# Patient Record
Sex: Female | Born: 1975 | Hispanic: No | Marital: Married | Smoking: Never smoker
Health system: Southern US, Community
[De-identification: ages and names within clinical notes are randomized; demographics above are authoritative.]

## PROBLEM LIST (undated history)

## (undated) ENCOUNTER — Ambulatory Visit: Payer: Managed Care, Other (non HMO)

## (undated) DIAGNOSIS — G243 Spasmodic torticollis: Secondary | ICD-10-CM

## (undated) DIAGNOSIS — N921 Excessive and frequent menstruation with irregular cycle: Secondary | ICD-10-CM

## (undated) DIAGNOSIS — M1711 Unilateral primary osteoarthritis, right knee: Secondary | ICD-10-CM

## (undated) DIAGNOSIS — T7840XA Allergy, unspecified, initial encounter: Secondary | ICD-10-CM

## (undated) HISTORY — PX: KNEE SURGERY: SHX244

## (undated) HISTORY — DX: Allergy, unspecified, initial encounter: T78.40XA

## (undated) HISTORY — DX: Unilateral primary osteoarthritis, right knee: M17.11

## (undated) HISTORY — DX: Spasmodic torticollis: G24.3

## (undated) HISTORY — DX: Excessive and frequent menstruation with irregular cycle: N92.1

---

## 2010-09-08 ENCOUNTER — Ambulatory Visit (INDEPENDENT_AMBULATORY_CARE_PROVIDER_SITE_OTHER): Payer: PRIVATE HEALTH INSURANCE | Admitting: Family Medicine

## 2010-09-08 ENCOUNTER — Encounter: Payer: Self-pay | Admitting: Family Medicine

## 2010-09-08 VITALS — BP 104/72 | HR 70 | Temp 99.0°F | Ht 65.5 in | Wt 111.0 lb

## 2010-09-08 DIAGNOSIS — G43909 Migraine, unspecified, not intractable, without status migrainosus: Secondary | ICD-10-CM

## 2010-09-08 DIAGNOSIS — R1031 Right lower quadrant pain: Secondary | ICD-10-CM

## 2010-09-08 DIAGNOSIS — G243 Spasmodic torticollis: Secondary | ICD-10-CM

## 2010-09-08 DIAGNOSIS — J300 Vasomotor rhinitis: Secondary | ICD-10-CM

## 2010-09-08 DIAGNOSIS — N921 Excessive and frequent menstruation with irregular cycle: Secondary | ICD-10-CM

## 2010-09-08 DIAGNOSIS — J309 Allergic rhinitis, unspecified: Secondary | ICD-10-CM

## 2010-09-08 DIAGNOSIS — E559 Vitamin D deficiency, unspecified: Secondary | ICD-10-CM

## 2010-09-08 MED ORDER — NONFORMULARY OR COMPOUNDED ITEM: Status: DC

## 2010-09-08 MED ORDER — AZELASTINE HCL 0.1 % NA SOLN: 2.0000 | Freq: Two times a day (BID) | NASAL | Status: DC

## 2010-09-08 MED ORDER — ERGOCALCIFEROL 1.25 MG (50000 UT) PO CAPS: 50000.0000 [IU] | ORAL_CAPSULE | ORAL | Status: AC

## 2010-09-09 ENCOUNTER — Encounter: Payer: Self-pay | Admitting: Family Medicine

## 2010-09-09 DIAGNOSIS — G243 Spasmodic torticollis: Secondary | ICD-10-CM | POA: Insufficient documentation

## 2010-09-09 DIAGNOSIS — N921 Excessive and frequent menstruation with irregular cycle: Secondary | ICD-10-CM | POA: Insufficient documentation

## 2010-09-09 DIAGNOSIS — G43909 Migraine, unspecified, not intractable, without status migrainosus: Secondary | ICD-10-CM | POA: Insufficient documentation

## 2010-09-09 NOTE — Progress Notes (Signed)
  Subjective:    Patient ID: Leslie Hughes, female    DOB: 1976-01-09, 35 y.o.   MRN: [id number]  HPI 35 yr old female to establish with [place] and to discuss chronic nasal congestion. 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 She describes daily nasal congestion without sinus pressure or itchy eyes or runny nose or sneezing. She has tried saline sprays, Flonase, and Nasonex with no improvement. She also complains of 3 days of mild sharp intermittent pains in the RLQ of the abdomen. These feel just like the ovarian cysts she has had in the past. No urinary or bowel symptoms. No nausea or fever. No back pain. Her LMP was 08-17-10, but her cycles are irregular most of the time.    Review of Systems  Constitutional: Negative.   HENT: Positive for congestion. Negative for nosebleeds, sore throat, rhinorrhea, sneezing, postnasal drip and sinus pressure.   Eyes: Negative.   Respiratory: Negative.   Gastrointestinal: Positive for abdominal pain. Negative for nausea, vomiting, diarrhea, constipation, blood in stool and abdominal distention.  Genitourinary: Negative.        Objective:   Physical Exam  Constitutional: She appears well-developed and well-nourished.  HENT:  Right Ear: External ear normal.  Left Ear: External ear normal.  Nose: Nose normal.  Mouth/Throat: Oropharynx is clear and moist. No oropharyngeal exudate.  Eyes: Conjunctivae are normal. Pupils are equal, round, and reactive to light.  Neck: No thyromegaly present.  Cardiovascular: Normal rate, regular rhythm, normal heart sounds and intact distal pulses.   Pulmonary/Chest: Effort normal and breath sounds normal.  Abdominal: Soft. Bowel sounds are normal. She exhibits no distension and no mass. There is no rebound and no guarding.       Very slightly tender in the RLQ  Lymphadenopathy:    She has no cervical adenopathy.          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 The RLQ pain is consistent with another ovarian cyst, so I recommended she see a GYN soon. She is trying to locate one in [facility] to see. She knows to be seen immediately if the pain gets worse, or if she develops any vomiting or fever.

## 2010-09-20 ENCOUNTER — Telehealth: Payer: Self-pay | Admitting: Family Medicine

## 2010-09-20 NOTE — Telephone Encounter (Signed)
Needs new rx for Progesterone Cream. Please send to a pharmacy that dr Clent Ridges had discussed with patient. She said that she did not know which pharmacy would do a compound rx.

## 2010-09-21 NOTE — Telephone Encounter (Signed)
I gave her a rx when I  saw her on 09-08-10. If she needs this to be called to a different pharmacy, please do so

## 2010-09-23 NOTE — Telephone Encounter (Signed)
I spoke with pt and pro compounding pharmacy. Pt was on Progesterone Cream 75 mg/ml apply 2 clicks daily, days 1-25 of cycle. Pt did not get a script while in office on 09/08/10. She would like script called to [facility] here in [place].

## 2010-09-24 MED ORDER — AMBULATORY NON FORMULARY MEDICATION: Status: DC

## 2010-09-24 NOTE — Telephone Encounter (Signed)
Please call in a one year supply

## 2010-09-24 NOTE — Telephone Encounter (Signed)
I called in script to [facility] and spoke with pt.

## 2011-01-10 ENCOUNTER — Ambulatory Visit (INDEPENDENT_AMBULATORY_CARE_PROVIDER_SITE_OTHER): Payer: PRIVATE HEALTH INSURANCE | Admitting: Family Medicine

## 2011-01-10 ENCOUNTER — Encounter: Payer: Self-pay | Admitting: Family Medicine

## 2011-01-10 VITALS — BP 106/68 | HR 107 | Temp 98.7°F | Wt 109.0 lb

## 2011-01-10 DIAGNOSIS — J329 Chronic sinusitis, unspecified: Secondary | ICD-10-CM

## 2011-01-10 MED ORDER — AZITHROMYCIN 250 MG PO TABS: ORAL_TABLET | ORAL | Status: AC

## 2011-01-10 MED ORDER — NONFORMULARY OR COMPOUNDED ITEM: Status: DC

## 2011-01-10 NOTE — Progress Notes (Signed)
  Subjective:    Patient ID: Leslie Hughes, female    DOB: 30-Dec-1975, 35 y.o.   MRN: [id number]  HPI Here for 3 days of sinus pressure, PND, ST, HA, a nd a dry cough. Using Mucinex D and cough drops.    Review of Systems  Constitutional: Negative.   HENT: Positive for congestion, postnasal drip and sinus pressure.   Eyes: Negative.   Respiratory: Positive for cough.        Objective:   Physical Exam  Constitutional: She appears well-developed and well-nourished.  HENT:  Right Ear: External ear normal.  Left Ear: External ear normal.  Nose: Nose normal.  Mouth/Throat: Oropharynx is clear and moist. No oropharyngeal exudate.  Eyes: Conjunctivae are normal. Pupils are equal, round, and reactive to light.  Neck: Neck supple. No thyromegaly present.  Pulmonary/Chest: Effort normal and breath sounds normal. No respiratory distress. She has no wheezes. She has no rales. She exhibits no tenderness.  Lymphadenopathy:    She has no cervical adenopathy.          Assessment & Plan:  Recheck prn

## 2011-03-17 ENCOUNTER — Encounter: Payer: Self-pay | Admitting: Family Medicine

## 2011-03-17 ENCOUNTER — Ambulatory Visit (INDEPENDENT_AMBULATORY_CARE_PROVIDER_SITE_OTHER): Payer: PRIVATE HEALTH INSURANCE | Admitting: Family Medicine

## 2011-03-17 VITALS — BP 100/60 | Temp 98.6°F | Wt 112.0 lb

## 2011-03-17 DIAGNOSIS — J329 Chronic sinusitis, unspecified: Secondary | ICD-10-CM

## 2011-03-17 NOTE — Patient Instructions (Signed)

## 2011-03-17 NOTE — Progress Notes (Signed)
Subjective:    Patient ID: Leslie Hughes, female    DOB: 1975-12-18, 35 y.o.   MRN: [id number]  HPI 35 year old white female, nonsmoker, patient of Dr. Clent Ridges his in with complaints of sneezing, scratchy throat, sinus pressure and pain going on for 3 days, and worsening. She's been taking Sudafed over-the-counter and that helped some. She's began to develop a low-grade fever. Has a history of sinusitis and pneumonia.   Review of Systems  Constitutional: Positive for fever, chills and fatigue.  HENT: Positive for congestion, sneezing, postnasal drip and sinus pressure.   Eyes: Negative.   Respiratory: Positive for cough.   Cardiovascular: Negative.   Musculoskeletal: Negative.   Neurological: Negative.   Hematological: Negative.    Past Medical History  Diagnosis Date  . Migraine     sees Dr. Kristian Covey of [facility]  . Cervical dystonia     sees Dr. Kristian Covey of [facility]  . Metrorrhagia   . Allergy   . Vitamin D deficiency     History   Social History  . Marital Status: Married    Spouse Name: N/A    Number of Children: N/A  . Years of Education: N/A   Occupational History  . Not on file.   Social History Main Topics  . Smoking status: Never Smoker   . Smokeless tobacco: Never Used  . Alcohol Use: 0.5 oz/week    1 drink(s) per week  . Drug Use: No  . Sexually Active: Not on file   Other Topics Concern  . Not on file   Social History Narrative  . No narrative on file    Past Surgical History  Procedure Date  . Knee surgery 1999 and 2008    arthroscopy to right knee (tennis injuries)  . Cesarean section 2009    Family History  Problem Relation Age of Onset  . Hypothyroidism Mother   . Thyroid disease Mother   . Cancer Father     bladder  . Hyperlipidemia Father   . Hypertension Father   . Skin cancer Paternal Grandmother   . Colon cancer Paternal Grandfather     Allergies  Allergen Reactions  . Amoxil     Current  Outpatient Prescriptions on File Prior to Visit  Medication Sig Dispense Refill  . AMBULATORY NON FORMULARY MEDICATION Progesterone cream 75 mg/ml. Directions apply 2 clicks days 1-25 of cycle.  3 Tube  3  . clonazePAM (KLONOPIN) 1 MG tablet Take 1 mg by mouth 2 (two) times daily as needed.        . Multiple Vitamin (MULTIVITAMIN) tablet Take 1 tablet by mouth daily.        . NONFORMULARY/COMPOUNDED ITEM Progesterone cream 75mg /ml to apply 0.5 ml to inner thighs daily on days 1-25 of cycle  1 each  11  . rizatriptan (MAXALT) 10 MG tablet Take 10 mg by mouth as needed. May repeat in 2 hours if needed       . tiZANidine (ZANAFLEX) 4 MG tablet Take 4 mg by mouth every 6 (six) hours as needed.       . trihexyphenidyl (ARTANE) 2 MG tablet Take 2 mg by mouth 2 (two) times daily. Take 1/2 tablet       . azelastine (ASTELIN) 137 MCG/SPRAY nasal spray Place 2 sprays into the nose 2 (two) times daily. Use in each nostril as directed  30 mL  11  . ergocalciferol (VITAMIN D2) 50000 UNITS capsule Take 1 capsule (50,000 Units total) by  mouth once a week.  1 capsule  0    BP 100/60  Temp(Src) 98.6 F (37 C) (Oral)  Wt 112 lb (50.803 kg)chart    Objective:   Physical Exam  Constitutional: She is oriented to person, place, and time. She appears well-developed and well-nourished.  HENT:  Right Ear: External ear normal.  Left Ear: External ear normal.  Mouth/Throat: Oropharynx is clear and moist.       Maxillary sinus tenderness noted to palpation.  Neck: Normal range of motion. Neck supple.  Cardiovascular: Normal rate, regular rhythm and normal heart sounds.   Pulmonary/Chest: Effort normal and breath sounds normal.  Musculoskeletal: Normal range of motion.  Neurological: She is alert and oriented to person, place, and time.  Skin: Skin is warm and dry.          Assessment & Plan:  Assessment: Early sinusitis   Plan: Z-Pak as directed. Over-the-counter antihistamine decongestant when  necessary. Rest. Drink plenty of fluids. Call if symptoms worsen or persist. Recheck as scheduled and when necessary

## 2011-03-18 ENCOUNTER — Telehealth: Payer: Self-pay | Admitting: Family Medicine

## 2011-03-18 MED ORDER — AZITHROMYCIN 250 MG PO TABS: ORAL_TABLET | ORAL | Status: AC

## 2011-03-18 NOTE — Telephone Encounter (Signed)
Please call in Zpak for patient.

## 2011-03-18 NOTE — Telephone Encounter (Signed)
Done

## 2011-03-18 NOTE — Telephone Encounter (Signed)
Pt was seen yesterday and was prescribed a z-pak. Pt went to pharmacy to pick up and it was not there. Pt requesting it be re-submitted.  [facility]- [phone]

## 2011-07-13 ENCOUNTER — Ambulatory Visit (INDEPENDENT_AMBULATORY_CARE_PROVIDER_SITE_OTHER): Payer: PRIVATE HEALTH INSURANCE | Admitting: Family Medicine

## 2011-07-13 ENCOUNTER — Encounter: Payer: Self-pay | Admitting: Family Medicine

## 2011-07-13 VITALS — BP 102/64 | HR 90 | Temp 98.7°F | Wt 106.0 lb

## 2011-07-13 DIAGNOSIS — L723 Sebaceous cyst: Secondary | ICD-10-CM

## 2011-07-13 DIAGNOSIS — G243 Spasmodic torticollis: Secondary | ICD-10-CM

## 2011-07-13 MED ORDER — DOXYCYCLINE HYCLATE 100 MG PO CAPS: 100.0000 mg | ORAL_CAPSULE | Freq: Two times a day (BID) | ORAL | Status: AC

## 2011-07-13 NOTE — Progress Notes (Signed)
  Subjective:    Patient ID: Leslie Hughes, female    DOB: 1975/08/31, 36 y.o.   MRN: [id number]  HPI Here for 2 reasons. First she has a recurrent painful cyst behind the left ear lobe which has been swollen again now for the past week. Sometimes it opens and drains. Also she asks about getting a second opinion about her chronic neck pain, which has been diagnosed as cervical dystonia. She is currently seeing a Neurologist in [place], and he has been giving her Botox injections which cover her entire head and neck. These injections are quite painful for her to get, and they are not very helpful either. She has had nerve ablations with steroid injections, but these did not work.    Review of Systems  Constitutional: Negative.   HENT: Positive for neck pain and neck stiffness.   Neurological: Positive for headaches.       Objective:   Physical Exam  Constitutional: She is oriented to person, place, and time. She appears well-developed and well-nourished.  HENT:  Head: Normocephalic and atraumatic.       There is a tender cystic lesion in the crease behind the left ear lobe  Eyes: Conjunctivae and EOM are normal. Pupils are equal, round, and reactive to light.  Neck: No thyromegaly present.  Lymphadenopathy:    She has no cervical adenopathy.  Neurological: She is alert and oriented to person, place, and time. No cranial nerve deficit.          Assessment & Plan:  We will treat the current infection in the sebaceous cyst with Doxycycline, and she can apply warm compresses. She needs to have a permanent resection of this cyst, so we will refer her to ENT. As for the cervical dystonia, we refer her to a Pain Management clinic to consider other forms of nerve ablation such as radio frequency or cryoablations.

## 2011-07-22 ENCOUNTER — Other Ambulatory Visit: Payer: Self-pay | Admitting: Otolaryngology

## 2011-08-30 ENCOUNTER — Telehealth: Payer: Self-pay | Admitting: Family Medicine

## 2011-08-30 DIAGNOSIS — E349 Endocrine disorder, unspecified: Secondary | ICD-10-CM

## 2011-08-30 NOTE — Telephone Encounter (Signed)
Pt called and said that she used to see a doctor in [place] re: Endocrinology issues. Pt said that it been over 2 yrs since pt had testing done in order to get Progesterone refilled. Pt is req to get a referral to Endocrinologists with the [facility]. Pls call.

## 2011-08-30 NOTE — Telephone Encounter (Signed)
Referral was done  

## 2011-09-01 NOTE — Telephone Encounter (Signed)
I left voice message with below information. 

## 2011-09-19 ENCOUNTER — Ambulatory Visit (INDEPENDENT_AMBULATORY_CARE_PROVIDER_SITE_OTHER): Payer: PRIVATE HEALTH INSURANCE | Admitting: Endocrinology

## 2011-09-19 ENCOUNTER — Encounter: Payer: Self-pay | Admitting: Endocrinology

## 2011-09-19 ENCOUNTER — Other Ambulatory Visit (INDEPENDENT_AMBULATORY_CARE_PROVIDER_SITE_OTHER): Payer: PRIVATE HEALTH INSURANCE

## 2011-09-19 VITALS — BP 108/78 | HR 71 | Temp 98.5°F | Ht 66.0 in | Wt 109.0 lb

## 2011-09-19 DIAGNOSIS — E559 Vitamin D deficiency, unspecified: Secondary | ICD-10-CM

## 2011-09-19 DIAGNOSIS — N921 Excessive and frequent menstruation with irregular cycle: Secondary | ICD-10-CM

## 2011-09-19 LAB — BASIC METABOLIC PANEL
Calcium: 9.7 mg/dL (ref 8.4–10.5)
GFR: 105.56 mL/min (ref >60.00)
Glucose, Bld: 84 mg/dL (ref 70–99)
Sodium: 139 mEq/L (ref 135–145)

## 2011-09-19 NOTE — Patient Instructions (Addendum)
blood tests are being requested for you today.  You will receive a letter with results. i don't know if the progesterone is helping your symptoms.  However, as long as you are on the IUD, it is safe in my opinion to continue it.  Also, it would be fine with me for a trial off it.

## 2011-09-19 NOTE — Progress Notes (Signed)
Subjective:    Patient ID: Leslie Hughes, female    DOB: [birth year], 36 y.o.   MRN: [id number]  HPI Pt says she had menarche at age 10.  G1P1 (2009, 1 son).  Pt says she has had an iud x 3 years. She says it took them 1 year to achieve pregnancy, but she was never rx'ed for infertility.   She has a few years of intermittent severe headaches throughout the head, and assoc n/v.  Pt says she has a persisitently low vitamin-d level, despite aggressive suppelmentation.   She says she was found to have osteopenia at age 17, when she had knee surgery.  Migraine was better during pregnancy, so she had labs.  She had labs showing high progesterone, so it was postulated that progesterone was having a protective effect on her sxs.  She has been on a topical progesterone supplement x 3 years.  She says this has helped the migraine, but not the other sxs.   She says dr Vickey Sages felt this might not be doing any good, but is ok with her continuing it as long as she is on effective contraception.  Past Medical History  Diagnosis Date  . Migraine     sees Dr. Kristian Covey of [facility]  . Cervical dystonia     sees Dr. Kristian Covey of [facility]  . Metrorrhagia   . Allergy   . Vitamin d deficiency     Past Surgical History  Procedure Date  . Knee surgery 1999 and 2008    arthroscopy to right knee (tennis injuries)  . Cesarean section 2009    History   Social History  . Marital Status: Married    Spouse Name: N/A    Number of Children: N/A  . Years of Education: N/A   Occupational History  . Not on file.   Social History Main Topics  . Smoking status: Never Smoker   . Smokeless tobacco: Never Used  . Alcohol Use: 0.5 oz/week    1 drink(s) per week  . Drug Use: No  . Sexually Active: Not on file   Other Topics Concern  . Not on file   Social History Narrative  . No narrative on file    Current Outpatient Prescriptions on File Prior to Visit  Medication Sig Dispense  Refill  . AMBULATORY NON FORMULARY MEDICATION Progesterone cream 75 mg/ml. Directions apply 2 clicks days 1-25 of cycle.  3 Tube  3  . Multiple Vitamin (MULTIVITAMIN) tablet Take 1 tablet by mouth daily.        . NONFORMULARY/COMPOUNDED ITEM Progesterone cream 75mg /ml to apply 0.5 ml to inner thighs daily on days 1-25 of cycle  1 each  11  . rizatriptan (MAXALT) 10 MG tablet Take 10 mg by mouth as needed. May repeat in 2 hours if needed       . tiZANidine (ZANAFLEX) 4 MG tablet Take 4 mg by mouth every 6 (six) hours as needed.       . clonazePAM (KLONOPIN) 1 MG tablet Take 1 mg by mouth 2 (two) times daily as needed.          Allergies  Allergen Reactions  . Amoxicillin     Family History  Problem Relation Age of Onset  . Hypothyroidism Mother   . Thyroid disease Mother   . Cancer Father     bladder  . Hyperlipidemia Father   . Hypertension Father   . Skin cancer Paternal Grandmother   . Colon  cancer Paternal Grandfather     BP 108/78  Pulse 71  Temp 98.5 F (36.9 C) (Oral)  Ht 5\' 6"  (1.676 m)  Wt 109 lb (49.442 kg)  BMI 17.59 kg/m2  SpO2 96%  LMP 09/12/2011  Review of Systems She has fatigue, knee pain, insomnia, myalgias, easy bruising, and muscle cramps.  She has regular menses.  denies fever, weight change, depression, hair loss, sob, memory loss, constipation, numbness, blurry vision, dry skin, rhinorrhea, and syncope.      Objective:   Physical Exam VS: see vs page GEN: no distress HEAD: head: no deformity eyes: no periorbital swelling, no proptosis external nose and ears are normal mouth: no lesion seen NECK: supple, thyroid is not enlarged CHEST WALL: no deformity LUNGS:  Clear to auscultation CV: reg rate and rhythm, no murmur ABD: abdomen is soft, nontender.  no hepatosplenomegaly.  not distended.  no hernia MUSCULOSKELETAL: muscle bulk and strength are grossly normal.  no obvious joint swelling.  gait is normal and steady EXTEMITIES: no deformity.  no  ulcer on the feet.  feet are of normal color and temp.  no edema PULSES: dorsalis pedis intact bilat.  no carotid bruit NEURO:  cn 2-12 grossly intact.   readily moves all 4's.  sensation is intact to touch on the feet SKIN:  Normal texture and temperature.  No rash or suspicious lesion is visible.   NODES:  None palpable at the neck. PSYCH: alert, oriented x3.  Does not appear anxious nor depressed.  Vit D, 25-Hydroxy 26 (L)      Assessment & Plan:  H/o osteopenia, uncertain etiology.  Vitamin-d deficiency, mild Migraine, apparently helped by topical progesterone

## 2011-09-20 ENCOUNTER — Encounter: Payer: Self-pay | Admitting: Endocrinology

## 2011-09-20 LAB — VITAMIN D 25 HYDROXY (VIT D DEFICIENCY, FRACTURES): Vit D, 25-Hydroxy: 26 ng/mL — ABNORMAL LOW (ref 30–89)

## 2011-09-21 ENCOUNTER — Telehealth: Payer: Self-pay | Admitting: *Deleted

## 2011-09-21 NOTE — Telephone Encounter (Signed)
Called pt to inform of lab results, pt informed (letter also mailed to pt). 

## 2012-01-17 ENCOUNTER — Encounter: Payer: Self-pay | Admitting: Family Medicine

## 2012-01-17 ENCOUNTER — Ambulatory Visit (INDEPENDENT_AMBULATORY_CARE_PROVIDER_SITE_OTHER): Payer: PRIVATE HEALTH INSURANCE | Admitting: Family Medicine

## 2012-01-17 VITALS — BP 100/62 | HR 71 | Temp 98.7°F | Wt 110.0 lb

## 2012-01-17 DIAGNOSIS — R0781 Pleurodynia: Secondary | ICD-10-CM

## 2012-01-17 DIAGNOSIS — R079 Chest pain, unspecified: Secondary | ICD-10-CM

## 2012-01-17 DIAGNOSIS — M542 Cervicalgia: Secondary | ICD-10-CM

## 2012-01-17 MED ORDER — NONFORMULARY OR COMPOUNDED ITEM: Status: DC

## 2012-01-17 NOTE — Progress Notes (Signed)
  Subjective:    Patient ID: Leslie Hughes, female    DOB: April 18, 1975, 36 y.o.   MRN: [id number]  HPI Here for a one year hx of intermittent pain and muscle spasms in the left upper abdomen. This started a year ago, and it is getting more frequent. Now it happens once a week. When it does start it lasts about a day and then stops. She describes the muscles just under the left lower ribs as knotting up and being painful. No trouble with urinating or BMs. She does yoga about once a week. No lifting weights. Also she feels like she has reached the maximum improvement that she can with seeing Dr. Philippa Chester at the [facility]. She asks if there is someone here she can see for her cervical dystonia.    Review of Systems  Constitutional: Negative.   HENT: Positive for neck pain and neck stiffness.   Gastrointestinal: Positive for abdominal pain. Negative for nausea, vomiting, diarrhea, constipation, blood in stool and abdominal distention.       Objective:   Physical Exam  Constitutional: She appears well-developed and well-nourished.  Abdominal: Soft. Bowel sounds are normal. She exhibits no distension and no mass. There is no rebound and no guarding.       She is slightly tender in the LUQ just below the left inferior anterior ribs           Assessment & Plan:  This seems to be a chronic muscle strain of the rectus muscles inserting onto the ribs. We will refer her to Dr. Ethelene Hal for this and also ask him to evaluate her neck.

## 2012-09-05 ENCOUNTER — Encounter: Payer: Self-pay | Admitting: Family Medicine

## 2012-09-05 ENCOUNTER — Ambulatory Visit (INDEPENDENT_AMBULATORY_CARE_PROVIDER_SITE_OTHER): Payer: PRIVATE HEALTH INSURANCE | Admitting: Family Medicine

## 2012-09-05 ENCOUNTER — Ambulatory Visit (INDEPENDENT_AMBULATORY_CARE_PROVIDER_SITE_OTHER)
Admission: RE | Admit: 2012-09-05 | Discharge: 2012-09-05 | Disposition: A | Discharge status: Home / Self Care | Payer: PRIVATE HEALTH INSURANCE | Source: Ambulatory Visit | Attending: Cardiology | Admitting: Cardiology

## 2012-09-05 VITALS — BP 110/70 | HR 77 | Temp 98.5°F | Wt 113.0 lb

## 2012-09-05 DIAGNOSIS — R1031 Right lower quadrant pain: Secondary | ICD-10-CM

## 2012-09-05 DIAGNOSIS — R109 Unspecified abdominal pain: Secondary | ICD-10-CM

## 2012-09-05 LAB — POCT URINALYSIS DIPSTICK
Glucose, UA: NEGATIVE
Leukocytes, UA: NEGATIVE
Nitrite, UA: NEGATIVE
Protein, UA: NEGATIVE
pH, UA: 6

## 2012-09-05 MED ORDER — IOHEXOL 300 MG/ML  SOLN: 100.0000 mL | Freq: Once | INTRAMUSCULAR | Status: AC | PRN

## 2012-09-05 NOTE — Progress Notes (Signed)
Quick Note:  I spoke with pt ______ 

## 2012-09-05 NOTE — Progress Notes (Signed)
  Subjective:    Patient ID: Leslie Hughes, female    DOB: June 25, 1975, 37 y.o.   MRN: [id number]  HPI Here for one week of RLQ pains that wax and wane. They are sharp in nature but they do not radiate. She has been nauseated but has not vomited. No fevers. Her BMs are normal. No urinary symptoms. Her LMP was 3 weeks ago. She has a Copper 7 IUD in place. Her appetite is decreased.    Review of Systems  Constitutional: Positive for appetite change. Negative for fever, chills, diaphoresis and unexpected weight change.  Respiratory: Negative.   Cardiovascular: Negative.   Gastrointestinal: Positive for nausea, abdominal pain and abdominal distention. Negative for vomiting, diarrhea, constipation and blood in stool.  Genitourinary: Negative.        Objective:   Physical Exam  Constitutional:  Alert, uncomfortable.   Cardiovascular: Normal rate, regular rhythm, normal heart sounds and intact distal pulses.   Pulmonary/Chest: Effort normal and breath sounds normal.  Abdominal: Soft. Bowel sounds are normal. She exhibits no distension and no mass. There is no guarding.  She is quite tender with positive  rebound in the RLQ           Assessment & Plan:  This could be appendicitis or an ovarian cyst, among other things. We will get labs and a stat CT of the abdomen and pelvis today.

## 2012-09-06 ENCOUNTER — Telehealth: Payer: Self-pay | Admitting: Family Medicine

## 2012-09-06 NOTE — Telephone Encounter (Signed)
[facility] called for results of pt's CT scan done 6/18. Pt was told to make her appt and a copy would be sent over there. Pt had appt @ 3:15 today, 6/19.

## 2012-09-07 NOTE — Telephone Encounter (Signed)
I faxed results to below location.

## 2013-03-27 ENCOUNTER — Encounter: Payer: Self-pay | Admitting: Family

## 2013-03-27 ENCOUNTER — Ambulatory Visit (INDEPENDENT_AMBULATORY_CARE_PROVIDER_SITE_OTHER): Payer: PRIVATE HEALTH INSURANCE | Admitting: Family

## 2013-03-27 VITALS — BP 100/60 | HR 90 | Temp 98.9°F | Wt 123.0 lb

## 2013-03-27 DIAGNOSIS — G43909 Migraine, unspecified, not intractable, without status migrainosus: Secondary | ICD-10-CM

## 2013-03-27 DIAGNOSIS — J019 Acute sinusitis, unspecified: Secondary | ICD-10-CM

## 2013-03-27 MED ORDER — DOXYCYCLINE HYCLATE 100 MG PO TABS: 100.0000 mg | ORAL_TABLET | Freq: Two times a day (BID) | ORAL | Status: DC

## 2013-03-27 MED ORDER — METHYLPREDNISOLONE 4 MG PO KIT: PACK | ORAL | Status: AC

## 2013-03-27 NOTE — Progress Notes (Signed)
Subjective:    Patient ID: Leslie Hughes, female    DOB: 10/30/1975, 38 y.o.   MRN: [id number]  HPI 38 year old white female, nonsmoker is in today with complaints of cough, congestion, sinus pressure, pain in her teeth x1 week but has worsened over the last 5 days. She's been taking over-the-counter Sudafed and NyQuil without much relief. Has a history of sinusitis.   Review of Systems  Constitutional: Negative.   HENT: Positive for congestion, postnasal drip, rhinorrhea, sinus pressure and sneezing.   Respiratory: Positive for cough. Negative for shortness of breath and wheezing.   Cardiovascular: Negative.   Gastrointestinal: Negative.   Musculoskeletal: Negative.   Skin: Negative.   Allergic/Immunologic: Negative.    Past Medical History  Diagnosis Date  . Migraine   . Cervical dystonia     sees Dr. Tonye Royalty at [facility]   . Metrorrhagia     sees Dr. Marylynn Pearson   . Allergy   . Vitamin D deficiency     History   Social History  . Marital Status: Married    Spouse Name: N/A    Number of Children: N/A  . Years of Education: N/A   Occupational History  . Not on file.   Social History Main Topics  . Smoking status: Never Smoker   . Smokeless tobacco: Never Used  . Alcohol Use: 0.5 oz/week    1 drink(s) per week  . Drug Use: No  . Sexual Activity: Not on file   Other Topics Concern  . Not on file   Social History Narrative  . No narrative on file    Past Surgical History  Procedure Laterality Date  . Knee surgery  1999 and 2008    arthroscopy to right knee (tennis injuries)  . Cesarean section  2009    Family History  Problem Relation Age of Onset  . Hypothyroidism Mother   . Thyroid disease Mother   . Cancer Father     bladder  . Hyperlipidemia Father   . Hypertension Father   . Skin cancer Paternal Grandmother   . Colon cancer Paternal Grandfather     Allergies  Allergen Reactions  . Amoxicillin     Current Outpatient  Prescriptions on File Prior to Visit  Medication Sig Dispense Refill  . Multiple Vitamin (MULTIVITAMIN) tablet Take 1 tablet by mouth daily.        . NONFORMULARY OR COMPOUNDED ITEM Progesterone cream 75mg /ml to apply 0.5 ml to inner thighs daily on days 1-25 of cycle  1 each  11  . rizatriptan (MAXALT) 10 MG tablet Take 10 mg by mouth as needed. May repeat in 2 hours if needed       . tiZANidine (ZANAFLEX) 4 MG tablet Take 4 mg by mouth every 6 (six) hours as needed.       . clonazePAM (KLONOPIN) 1 MG tablet Take 0.5 mg by mouth daily as needed.        No current facility-administered medications on file prior to visit.    BP 100/60  Pulse 90  Temp(Src) 98.9 F (37.2 C) (Oral)  Wt 123 lb (55.792 kg)chart    Objective:   Physical Exam  Constitutional: She is oriented to person, place, and time. She appears well-developed and well-nourished.  HENT:  Right Ear: External ear normal.  Left Ear: External ear normal.  Nose: Nose normal.  Mouth/Throat: Oropharynx is clear and moist.  Sinus tenderness to palpation of the maxillary sinuses bilaterally  Neck:  Normal range of motion. Neck supple.  Cardiovascular: Normal rate, regular rhythm and normal heart sounds.   Pulmonary/Chest: Effort normal and breath sounds normal.  Musculoskeletal: Normal range of motion.  Neurological: She is alert and oriented to person, place, and time.  Skin: Skin is warm and dry.  Psychiatric: She has a normal mood and affect.          Assessment & Plan:  Assessment: 1. Acute sinusitis 2. Cough  Plan: We'll treat with a Medrol Dosepak as directed. Doxycycline 100 mg one tablet twice a day x10 days. Rest. Troponin fluids. Patient called the office if symptoms worsen or persist. Recheck as scheduled, and as needed.

## 2013-03-27 NOTE — Patient Instructions (Signed)

## 2013-04-16 ENCOUNTER — Other Ambulatory Visit: Payer: Self-pay | Admitting: Family Medicine

## 2013-04-16 NOTE — Telephone Encounter (Signed)
Should be one each, not 1 ml each.

## 2013-04-16 NOTE — Telephone Encounter (Signed)
Call in 25 ml with 11 rf

## 2013-04-16 NOTE — Telephone Encounter (Signed)
I spoke with pharmacy and 13 ml is enough for a 30 day supply.

## 2013-07-14 ENCOUNTER — Emergency Department (HOSPITAL_BASED_OUTPATIENT_CLINIC_OR_DEPARTMENT_OTHER)
Admission: EM | Admit: 2013-07-14 | Discharge: 2013-07-14 | Disposition: A | Discharge status: Home / Self Care | Payer: No Typology Code available for payment source | Attending: Emergency Medicine | Admitting: Emergency Medicine

## 2013-07-14 ENCOUNTER — Emergency Department (HOSPITAL_BASED_OUTPATIENT_CLINIC_OR_DEPARTMENT_OTHER): Payer: No Typology Code available for payment source

## 2013-07-14 DIAGNOSIS — G43909 Migraine, unspecified, not intractable, without status migrainosus: Secondary | ICD-10-CM | POA: Insufficient documentation

## 2013-07-14 DIAGNOSIS — Z792 Long term (current) use of antibiotics: Secondary | ICD-10-CM | POA: Insufficient documentation

## 2013-07-14 DIAGNOSIS — Z8639 Personal history of other endocrine, nutritional and metabolic disease: Secondary | ICD-10-CM | POA: Insufficient documentation

## 2013-07-14 DIAGNOSIS — Z8742 Personal history of other diseases of the female genital tract: Secondary | ICD-10-CM | POA: Insufficient documentation

## 2013-07-14 DIAGNOSIS — Z862 Personal history of diseases of the blood and blood-forming organs and certain disorders involving the immune mechanism: Secondary | ICD-10-CM | POA: Insufficient documentation

## 2013-07-14 DIAGNOSIS — Z880 Allergy status to penicillin: Secondary | ICD-10-CM | POA: Insufficient documentation

## 2013-07-14 DIAGNOSIS — Z8669 Personal history of other diseases of the nervous system and sense organs: Secondary | ICD-10-CM | POA: Insufficient documentation

## 2013-07-14 MED ORDER — DIPHENHYDRAMINE HCL 50 MG/ML IJ SOLN
12.5000 mg | Freq: Once | INTRAMUSCULAR | Status: AC
  Administered 2013-07-14: 12.5 mg via INTRAVENOUS
  Filled 2013-07-14: qty 1

## 2013-07-14 MED ORDER — ONDANSETRON HCL 4 MG/2ML IJ SOLN: 4.0000 mg | Freq: Once | INTRAMUSCULAR | Status: AC

## 2013-07-14 MED ORDER — DEXAMETHASONE SODIUM PHOSPHATE 10 MG/ML IJ SOLN
10.0000 mg | Freq: Once | INTRAMUSCULAR | Status: AC
  Administered 2013-07-14: 10 mg via INTRAVENOUS
  Filled 2013-07-14: qty 1

## 2013-07-14 MED ORDER — ONDANSETRON HCL 4 MG/2ML IJ SOLN
INTRAMUSCULAR | Status: AC
  Administered 2013-07-14: 4 mg via INTRAVENOUS
  Filled 2013-07-14: qty 2

## 2013-07-14 MED ORDER — METOCLOPRAMIDE HCL 5 MG/ML IJ SOLN
10.0000 mg | Freq: Once | INTRAMUSCULAR | Status: AC
  Administered 2013-07-14: 10 mg via INTRAVENOUS
  Filled 2013-07-14: qty 2

## 2013-07-14 NOTE — ED Provider Notes (Signed)
CSN: [id number]     Arrival date & time 07/14/13  1846 History   First MD Initiated Contact with Patient 07/14/13 1918     Chief Complaint  Patient presents with  . Migraine     (Consider location/radiation/quality/duration/timing/severity/associated sxs/prior Treatment) Patient is a 38 y.o. female presenting with migraines. The history is provided by the patient. No language interpreter was used.  Migraine This is a new problem. The current episode started today. Associated symptoms include headaches, nausea and vomiting. Pertinent negatives include no abdominal pain, chest pain or fever. Associated symptoms comments: Headache like her typical migraine headache starting this afternoon. She took her Maxalt and phenergan but did not get the usual relief. She presents for evaluation of severe headache pain associated with nausea and vomiting. .    Past Medical History  Diagnosis Date  . Migraine   . Cervical dystonia     sees Dr. Tonye Royalty at [facility]   . Metrorrhagia     sees Dr. Marylynn Pearson   . Allergy   . Vitamin D deficiency    Past Surgical History  Procedure Laterality Date  . Knee surgery  1999 and 2008    arthroscopy to right knee (tennis injuries)  . Cesarean section  2009   Family History  Problem Relation Age of Onset  . Hypothyroidism Mother   . Thyroid disease Mother   . Cancer Father     bladder  . Hyperlipidemia Father   . Hypertension Father   . Skin cancer Paternal Grandmother   . Colon cancer Paternal Grandfather    History  Substance Use Topics  . Smoking status: Never Smoker   . Smokeless tobacco: Never Used  . Alcohol Use: 0.5 oz/week    1 drink(s) per week   OB History   Grav Para Term Preterm Abortions TAB SAB Ect Mult Living                 Review of Systems  Constitutional: Negative for fever.  Respiratory: Negative for shortness of breath.   Cardiovascular: Negative for chest pain.  Gastrointestinal: Positive for nausea and  vomiting. Negative for abdominal pain.  Neurological: Positive for headaches.      Allergies  Amoxicillin  Home Medications   Prior to Admission medications   Medication Sig Start Date End Date Taking? Authorizing Provider  clonazePAM (KLONOPIN) 1 MG tablet Take 0.5 mg by mouth daily as needed.     Historical Provider, MD  doxycycline (VIBRA-TABS) 100 MG tablet Take 1 tablet (100 mg total) by mouth 2 (two) times daily. 03/27/13   Timoteo Gaul, FNP  Multiple Vitamin (MULTIVITAMIN) tablet Take 1 tablet by mouth daily.      Historical Provider, MD  NONFORMULARY OR COMPOUNDED ITEM APPLY 0.5 ML TO INNER THIGHS DAILY ON DAYS 1-25 OF CYCLE. 04/16/13   Laurey Morale, MD  rizatriptan (MAXALT) 10 MG tablet Take 10 mg by mouth as needed. May repeat in 2 hours if needed     Historical Provider, MD  tiZANidine (ZANAFLEX) 4 MG tablet Take 4 mg by mouth every 6 (six) hours as needed.     Historical Provider, MD   BP 106/72  Pulse 93  Temp(Src) 97.8 F (36.6 C) (Oral)  Resp 16  SpO2 99% Physical Exam  Constitutional: She is oriented to person, place, and time. She appears well-developed and well-nourished.  HENT:  Head: Normocephalic.  Eyes: Pupils are equal, round, and reactive to light.  Neck: Normal range of motion.  Neck supple.  Cardiovascular: Normal rate and regular rhythm.   Pulmonary/Chest: Effort normal and breath sounds normal.  Abdominal: Soft. Bowel sounds are normal. There is no tenderness. There is no rebound and no guarding.  Musculoskeletal: Normal range of motion.  Neurological: She is alert and oriented to person, place, and time. She has normal strength and normal reflexes. No sensory deficit. She displays a negative Romberg sign. Coordination normal.  CN's 3-12 grossly intact. Normal coordination.  Skin: Skin is warm and dry. No rash noted.  Psychiatric: She has a normal mood and affect.    ED Course  Procedures (including critical care time) Labs Review Labs  Reviewed - No data to display  Imaging Review Ct Head Wo Contrast  07/14/2013   CLINICAL DATA:  History of migraines and tingling in both arms since 1 p.m. today; also vomiting  EXAM: CT HEAD WITHOUT CONTRAST  TECHNIQUE: Contiguous axial images were obtained from the base of the skull through the vertex without intravenous contrast.  COMPARISON:  CT Sinus w/o contrast dated 03/12/2010  FINDINGS: The ventricles are normal in size and position. There is no intracranial hemorrhage nor intracranial mass effect. There is no evidence of an evolving ischemic infarction. The cerebellum and brainstem are normal in density. There are punctate basal ganglia calcifications bilaterally. No abnormal intracranial calcifications are demonstrated.  At bone window settings the observed portions of the paranasal sinuses and mastoid air cells are clear. There is no evidence of an acute or old skull fracture. No lytic or blastic skull lesion is demonstrated.  IMPRESSION: 1. There is no acute intracranial hemorrhage nor evidence of an evolving ischemic event. 2. There is no intracranial mass effect nor hydrocephalus. 3. The observed portions of the paranasal sinuses and mastoid air cells are clear.   Electronically Signed   By: David  Martinique   On: 07/14/2013 19:40     EKG Interpretation None      MDM   Final diagnoses:  None    1. Migraine headache  She feels much better after headache cocktail of Reglan, Benadryl and Decadron. CT head negative. She reports this headache is now manageable at home with usual medications. No neurologic deficits on exam.    Dewaine Oats, PA-C 07/14/13 2101

## 2013-07-14 NOTE — ED Provider Notes (Signed)
Medical screening examination/treatment/procedure(s) were performed by non-physician practitioner and as supervising physician I was immediately available for consultation/collaboration.   EKG Interpretation None        Malvin Johns, MD 07/14/13 2110

## 2013-07-14 NOTE — Discharge Instructions (Signed)
Migraine Headache A migraine headache is an intense, throbbing pain on one or both sides of your head. A migraine can last for 30 minutes to several hours. CAUSES  The exact cause of a migraine headache is not always known. However, a migraine may be caused when nerves in the brain become irritated and release chemicals that cause inflammation. This causes pain. Certain things may also trigger migraines, such as:  Alcohol.  Smoking.  Stress.  Menstruation.  Aged cheeses.  Foods or drinks that contain nitrates, glutamate, aspartame, or tyramine.  Lack of sleep.  Chocolate.  Caffeine.  Hunger.  Physical exertion.  Fatigue.  Medicines used to treat chest pain (nitroglycerine), birth control pills, estrogen, and some blood pressure medicines. SIGNS AND SYMPTOMS  Pain on one or both sides of your head.  Pulsating or throbbing pain.  Severe pain that prevents daily activities.  Pain that is aggravated by any physical activity.  Nausea, vomiting, or both.  Dizziness.  Pain with exposure to bright lights, loud noises, or activity.  General sensitivity to bright lights, loud noises, or smells. Before you get a migraine, you may get warning signs that a migraine is coming (aura). An aura may include:  Seeing flashing lights.  Seeing bright spots, halos, or zig-zag lines.  Having tunnel vision or blurred vision.  Having feelings of numbness or tingling.  Having trouble talking.  Having muscle weakness. DIAGNOSIS  A migraine headache is often diagnosed based on:  Symptoms.  Physical exam.  A CT scan or MRI of your head. These imaging tests cannot diagnose migraines, but they can help rule out other causes of headaches. TREATMENT Medicines may be given for pain and nausea. Medicines can also be given to help prevent recurrent migraines.  HOME CARE INSTRUCTIONS  Only take over-the-counter or prescription medicines for pain or discomfort as directed by your  health care provider. The use of long-term narcotics is not recommended.  Lie down in a dark, quiet room when you have a migraine.  Keep a journal to find out what may trigger your migraine headaches. For example, write down:  What you eat and drink.  How much sleep you get.  Any change to your diet or medicines.  Limit alcohol consumption.  Quit smoking if you smoke.  Get 7 9 hours of sleep, or as recommended by your health care provider.  Limit stress.  Keep lights dim if bright lights bother you and make your migraines worse. SEEK IMMEDIATE MEDICAL CARE IF:   Your migraine becomes severe.  You have a fever.  You have a stiff neck.  You have vision loss.  You have muscular weakness or loss of muscle control.  You start losing your balance or have trouble walking.  You feel faint or pass out.  You have severe symptoms that are different from your first symptoms. MAKE SURE YOU:   Understand these instructions.  Will watch your condition.  Will get help right away if you are not doing well or get worse. Document Released: 03/07/2005 Document Revised: 12/26/2012 Document Reviewed: 11/12/2012 Exit[employer] Patient Information 2014 [employer].  

## 2013-07-14 NOTE — ED Notes (Signed)
Patient transported to CT 

## 2013-07-14 NOTE — ED Notes (Signed)
Patient co migraine and tingling in both arms since 13:00, too two maxalt and phenergan but no relief. vomiting

## 2013-07-29 ENCOUNTER — Ambulatory Visit (INDEPENDENT_AMBULATORY_CARE_PROVIDER_SITE_OTHER): Payer: PRIVATE HEALTH INSURANCE | Admitting: Family Medicine

## 2013-07-29 ENCOUNTER — Encounter: Payer: Self-pay | Admitting: Family Medicine

## 2013-07-29 VITALS — BP 114/86 | HR 73 | Temp 98.4°F | Ht 66.0 in | Wt 122.0 lb

## 2013-07-29 DIAGNOSIS — G43909 Migraine, unspecified, not intractable, without status migrainosus: Secondary | ICD-10-CM

## 2013-07-29 MED ORDER — PROMETHAZINE HCL 25 MG PO TABS: 25.0000 mg | ORAL_TABLET | ORAL | Status: DC | PRN

## 2013-07-29 MED ORDER — TOPIRAMATE 50 MG PO TABS: 50.0000 mg | ORAL_TABLET | Freq: Every day | ORAL | Status: DC

## 2013-07-29 MED ORDER — RIZATRIPTAN BENZOATE 10 MG PO TABS: 10.0000 mg | ORAL_TABLET | ORAL | Status: DC | PRN

## 2013-07-29 NOTE — Progress Notes (Signed)
   Subjective:    Patient ID: Leslie Hughes, female    DOB: 1976-02-28, 38 y.o.   MRN: [id number]  HPI Here to discuss her migraines. They have been coming more frequently lately and she is averaging one or two a month. They have become more intense and she has more vomiting with them. Maxalt does not seem to work as well as it used to. She wound up in the ER a few weeks ago to get IV meds for a migraine.    Review of Systems  Constitutional: Negative.   Neurological: Positive for headaches.       Objective:   Physical Exam  Constitutional: She is oriented to person, place, and time. She appears well-developed and well-nourished. No distress.  Eyes: Conjunctivae are normal. Pupils are equal, round, and reactive to light.  Neurological: She is alert and oriented to person, place, and time. No cranial nerve deficit. She exhibits normal muscle tone. Coordination normal.  Psychiatric: She has a normal mood and affect. Her behavior is normal. Thought content normal.          Assessment & Plan:  Try Topamax daily for prophylaxis. Recheck prn

## 2013-07-29 NOTE — Progress Notes (Signed)
Pre visit review using our clinic review tool, if applicable. No additional management support is needed unless otherwise documented below in the visit note. 

## 2013-09-12 ENCOUNTER — Other Ambulatory Visit: Payer: Self-pay | Admitting: Family Medicine

## 2013-10-24 ENCOUNTER — Other Ambulatory Visit: Payer: Self-pay | Admitting: Family Medicine

## 2014-01-02 ENCOUNTER — Ambulatory Visit (INDEPENDENT_AMBULATORY_CARE_PROVIDER_SITE_OTHER): Payer: Commercial Managed Care - PPO | Admitting: Family Medicine

## 2014-01-02 ENCOUNTER — Encounter: Payer: Self-pay | Admitting: Family Medicine

## 2014-01-02 VITALS — BP 104/79 | HR 80 | Temp 98.6°F | Ht 66.0 in | Wt 129.0 lb

## 2014-01-02 DIAGNOSIS — R5383 Other fatigue: Secondary | ICD-10-CM

## 2014-01-02 DIAGNOSIS — J0100 Acute maxillary sinusitis, unspecified: Secondary | ICD-10-CM

## 2014-01-02 DIAGNOSIS — G4700 Insomnia, unspecified: Secondary | ICD-10-CM

## 2014-01-02 LAB — HEPATIC FUNCTION PANEL
ALBUMIN: 3.9 g/dL (ref 3.5–5.2)
ALT: 15 U/L (ref 0–35)
AST: 18 U/L (ref 0–37)
Alkaline Phosphatase: 53 U/L (ref 39–117)
Bilirubin, Direct: 0 mg/dL (ref 0.0–0.3)
TOTAL PROTEIN: 8.4 g/dL — AB (ref 6.0–8.3)
Total Bilirubin: 0.6 mg/dL (ref 0.2–1.2)

## 2014-01-02 LAB — CBC WITH DIFFERENTIAL/PLATELET
Basophils Absolute: 0 10*3/uL (ref 0.0–0.1)
Basophils Relative: 0.3 % (ref 0.0–3.0)
EOS ABS: 0.1 10*3/uL (ref 0.0–0.7)
Eosinophils Relative: 0.8 % (ref 0.0–5.0)
HCT: 42.4 % (ref 36.0–46.0)
HEMOGLOBIN: 14.3 g/dL (ref 12.0–15.0)
Lymphocytes Relative: 20.1 % (ref 12.0–46.0)
Lymphs Abs: 1.5 10*3/uL (ref 0.7–4.0)
MCHC: 33.7 g/dL (ref 30.0–36.0)
MCV: 91 fl (ref 78.0–100.0)
MONO ABS: 0.7 10*3/uL (ref 0.1–1.0)
Monocytes Relative: 9.7 % (ref 3.0–12.0)
NEUTROS ABS: 5.3 10*3/uL (ref 1.4–7.7)
NEUTROS PCT: 69.1 % (ref 43.0–77.0)
Platelets: 278 10*3/uL (ref 150.0–400.0)
RBC: 4.66 Mil/uL (ref 3.87–5.11)
RDW: 12.3 % (ref 11.5–15.5)
WBC: 7.7 10*3/uL (ref 4.0–10.5)

## 2014-01-02 LAB — BASIC METABOLIC PANEL
BUN: 9 mg/dL (ref 6–23)
CALCIUM: 9.5 mg/dL (ref 8.4–10.5)
CO2: 23 mEq/L (ref 19–32)
Chloride: 106 mEq/L (ref 96–112)
Creatinine, Ser: 0.8 mg/dL (ref 0.4–1.2)
GFR: 86.34 mL/min (ref >60.00)
Glucose, Bld: 83 mg/dL (ref 70–99)
Potassium: 5 mEq/L (ref 3.5–5.1)
SODIUM: 138 meq/L (ref 135–145)

## 2014-01-02 LAB — VITAMIN B12: Vitamin B-12: 231 pg/mL (ref 211–911)

## 2014-01-02 LAB — TSH: TSH: 0.83 u[IU]/mL (ref 0.35–4.50)

## 2014-01-02 MED ORDER — METHYLPREDNISOLONE ACETATE 40 MG/ML IJ SUSP
120.0000 mg | Freq: Once | INTRAMUSCULAR | Status: AC
Start: 2014-01-02 — End: 2014-01-02
  Administered 2014-01-02: 120 mg via INTRAMUSCULAR

## 2014-01-02 MED ORDER — AZITHROMYCIN 250 MG PO TABS: ORAL_TABLET | ORAL | Status: DC

## 2014-01-02 MED ORDER — TEMAZEPAM 30 MG PO CAPS: 30.0000 mg | ORAL_CAPSULE | Freq: Every evening | ORAL | Status: DC | PRN

## 2014-01-02 NOTE — Addendum Note (Signed)
Addended by: Aggie Hacker A on: 01/02/2014 10:30 AM   Modules accepted: Orders

## 2014-01-02 NOTE — Progress Notes (Signed)
   Subjective:    Patient ID: Leslie Hughes, female    DOB: 08-22-1975, 38 y.o.   MRN: [id number]  HPI Here for several things. First she has had trouble sleeping for years and this has been worse lately. She feels tired all the time and she thinks sleep deprivation is the main reason for this. She exercises almost every day, but she has a lot of stress between her job and her family obligations. Also for 10 days she has had sinus pressure, PND, ST , and a dry cough. Using Nyquil.    Review of Systems  Constitutional: Positive for fatigue. Negative for fever.  HENT: Positive for congestion, postnasal drip and sinus pressure.   Eyes: Negative.   Respiratory: Positive for cough.        Objective:   Physical Exam  Constitutional: She appears well-developed and well-nourished.  HENT:  Right Ear: External ear normal.  Left Ear: External ear normal.  Nose: Nose normal.  Mouth/Throat: Oropharynx is clear and moist.  Eyes: Conjunctivae are normal.  Neck: No thyromegaly present.  Pulmonary/Chest: Effort normal and breath sounds normal.  Lymphadenopathy:    She has no cervical adenopathy.          Assessment & Plan:  Treat the sinusitis with a Zpack. Try Temazepam for sleep. I agree with her that the insomnia is probably the main reason she is always tired but we will screen with some labs today.

## 2014-01-02 NOTE — Progress Notes (Signed)
Pre visit review using our clinic review tool, if applicable. No additional management support is needed unless otherwise documented below in the visit note. 

## 2014-02-05 ENCOUNTER — Other Ambulatory Visit: Payer: Self-pay | Admitting: Obstetrics and Gynecology

## 2014-02-10 LAB — CYTOLOGY - PAP

## 2014-02-17 ENCOUNTER — Telehealth: Payer: Self-pay | Admitting: Family Medicine

## 2014-02-17 ENCOUNTER — Ambulatory Visit: Payer: Commercial Managed Care - PPO | Admitting: Family Medicine

## 2014-02-17 NOTE — Telephone Encounter (Signed)
Pt was on Dr. Barbie Banner schedule for today, I left a voice message to advise pt to follow up as needed.

## 2014-04-04 ENCOUNTER — Telehealth: Payer: Self-pay | Admitting: Family Medicine

## 2014-04-04 MED ORDER — NONFORMULARY OR COMPOUNDED ITEM: Status: DC

## 2014-04-04 NOTE — Telephone Encounter (Signed)
Script was printed and faxed to pharmacy.

## 2014-04-04 NOTE — Telephone Encounter (Signed)
Refill for one year 

## 2014-04-04 NOTE — Telephone Encounter (Signed)
Refill request for Progesterone 75mg /ml cream, apply 0.5 ml to inner thighs daily on days 1-25 of cycle and send to [facility].

## 2014-07-10 ENCOUNTER — Other Ambulatory Visit: Payer: Self-pay | Admitting: Family Medicine

## 2014-07-14 NOTE — Telephone Encounter (Signed)
Refill for 6 months. 

## 2014-08-13 ENCOUNTER — Ambulatory Visit (INDEPENDENT_AMBULATORY_CARE_PROVIDER_SITE_OTHER): Payer: Commercial Managed Care - PPO | Admitting: Family Medicine

## 2014-08-13 ENCOUNTER — Encounter: Payer: Self-pay | Admitting: Family Medicine

## 2014-08-13 VITALS — BP 104/70 | HR 94 | Temp 99.3°F | Wt 128.5 lb

## 2014-08-13 DIAGNOSIS — J039 Acute tonsillitis, unspecified: Secondary | ICD-10-CM | POA: Diagnosis not present

## 2014-08-13 DIAGNOSIS — J020 Streptococcal pharyngitis: Secondary | ICD-10-CM

## 2014-08-13 LAB — POCT RAPID STREP A (OFFICE): RAPID STREP A SCREEN: NEGATIVE

## 2014-08-13 MED ORDER — CEPHALEXIN 500 MG PO CAPS: 500.0000 mg | ORAL_CAPSULE | Freq: Four times a day (QID) | ORAL | Status: DC

## 2014-08-13 NOTE — Progress Notes (Signed)
   Subjective:    Patient ID: Leslie Hughes, female    DOB: 1975-03-30, 39 y.o.   MRN: [id number]  HPI Here for one month of swelling in the left side of her throat, some mild trouble swallowing, and a tender lump in the left anterior neck. No fever that she is aware of. No coughing. No ST per se.    Review of Systems  Constitutional: Negative.   HENT: Positive for trouble swallowing. Negative for congestion, postnasal drip, sinus pressure, sore throat and voice change.   Eyes: Negative.   Respiratory: Negative.        Objective:   Physical Exam  Constitutional: She appears well-developed and well-nourished. No distress.  HENT:  Right Ear: External ear normal.  Left Ear: External ear normal.  Nose: Nose normal.  The area around the left tonsil is red and mildly swollen. No exudate is seen.   Neck: Neck supple. No thyromegaly present.  Single enlarged tender left AC node   Pulmonary/Chest: Effort normal and breath sounds normal. No respiratory distress. She has no wheezes. She has no rales.          Assessment & Plan:  Tonsillitis vs a small peritonsillar abscess. Treat with Keflex and recheck prn

## 2014-08-13 NOTE — Progress Notes (Signed)
Pre visit review using our clinic review tool, if applicable. No additional management support is needed unless otherwise documented below in the visit note. 

## 2014-08-26 ENCOUNTER — Encounter: Payer: Self-pay | Admitting: Family Medicine

## 2014-08-26 ENCOUNTER — Ambulatory Visit (INDEPENDENT_AMBULATORY_CARE_PROVIDER_SITE_OTHER): Payer: Commercial Managed Care - PPO | Admitting: Family Medicine

## 2014-08-26 VITALS — BP 102/60 | HR 74 | Temp 98.3°F | Wt 128.0 lb

## 2014-08-26 DIAGNOSIS — M542 Cervicalgia: Secondary | ICD-10-CM

## 2014-08-26 LAB — CBC WITH DIFFERENTIAL/PLATELET
Basophils Absolute: 0 10*3/uL (ref 0.0–0.1)
Basophils Relative: 0.1 % (ref 0.0–3.0)
Eosinophils Absolute: 0 10*3/uL (ref 0.0–0.7)
Eosinophils Relative: 0.3 % (ref 0.0–5.0)
HCT: 42.3 % (ref 36.0–46.0)
HEMOGLOBIN: 14.3 g/dL (ref 12.0–15.0)
LYMPHS ABS: 2.1 10*3/uL (ref 0.7–4.0)
LYMPHS PCT: 21.2 % (ref 12.0–46.0)
MCHC: 33.8 g/dL (ref 30.0–36.0)
MCV: 90.1 fl (ref 78.0–100.0)
MONOS PCT: 5.4 % (ref 3.0–12.0)
Monocytes Absolute: 0.5 10*3/uL (ref 0.1–1.0)
Neutro Abs: 7.1 10*3/uL (ref 1.4–7.7)
Neutrophils Relative %: 73 % (ref 43.0–77.0)
Platelets: 306 10*3/uL (ref 150.0–400.0)
RBC: 4.7 Mil/uL (ref 3.87–5.11)
RDW: 13.3 % (ref 11.5–15.5)
WBC: 9.7 10*3/uL (ref 4.0–10.5)

## 2014-08-26 LAB — BASIC METABOLIC PANEL
BUN: 12 mg/dL (ref 6–23)
CO2: 23 meq/L (ref 19–32)
Calcium: 9.8 mg/dL (ref 8.4–10.5)
Chloride: 105 mEq/L (ref 96–112)
Creatinine, Ser: 0.7 mg/dL (ref 0.40–1.20)
GFR: 100.36 mL/min (ref >60.00)
GLUCOSE: 68 mg/dL — AB (ref 70–99)
Potassium: 3.7 mEq/L (ref 3.5–5.1)
Sodium: 139 mEq/L (ref 135–145)

## 2014-08-26 MED ORDER — LEVOFLOXACIN 500 MG PO TABS: 500.0000 mg | ORAL_TABLET | Freq: Every day | ORAL | Status: AC

## 2014-08-26 NOTE — Progress Notes (Signed)
Pre visit review using our clinic review tool, if applicable. No additional management support is needed unless otherwise documented below in the visit note. 

## 2014-08-26 NOTE — Progress Notes (Signed)
   Subjective:    Patient ID: Leslie Hughes, female    DOB: [birth year], 39 y.o.   MRN: [id number]  HPI Here for 6 weeks of symptoms that will not go away. These include a ST, anterior throat and neck pain, swelling in the neck, trouble swallowing, sinus pressure, and generalized fatigue. No fever but she as felt some sweats. She was seen on 08-13-14 and was given Keflex, but this has not helped.    Review of Systems  Constitutional: Positive for diaphoresis and fatigue. Negative for fever.  HENT: Positive for congestion, postnasal drip, sinus pressure, sore throat and trouble swallowing. Negative for ear pain and voice change.   Eyes: Negative.   Respiratory: Negative.   Cardiovascular: Negative.   Gastrointestinal: Negative.        Objective:   Physical Exam  Constitutional: She appears well-developed and well-nourished.  HENT:  Right Ear: External ear normal.  Left Ear: External ear normal.  Nose: Nose normal.  Mouth/Throat: Oropharynx is clear and moist. No oropharyngeal exudate.  Eyes: Conjunctivae are normal.  Neck: Neck supple. No thyromegaly present.  The anterior neck is tender, especially on the left side, but no masses or nodes are felt   Cardiovascular: Normal rate, regular rhythm, normal heart sounds and intact distal pulses.   Pulmonary/Chest: Effort normal and breath sounds normal.  Lymphadenopathy:    She has no cervical adenopathy.          Assessment & Plan:  It is not clear what the etiology of these sx may be. She nay have a sinusitis so we will start her on Levaquin 500 mg daily. Get labs today and set up a neck CT to rule out a peritonsillar abscess.

## 2014-08-27 ENCOUNTER — Ambulatory Visit (INDEPENDENT_AMBULATORY_CARE_PROVIDER_SITE_OTHER)
Admission: RE | Admit: 2014-08-27 | Discharge: 2014-08-27 | Disposition: A | Discharge status: Home / Self Care | Payer: Commercial Managed Care - PPO | Source: Ambulatory Visit | Attending: Family Medicine | Admitting: Family Medicine

## 2014-08-27 DIAGNOSIS — M542 Cervicalgia: Secondary | ICD-10-CM

## 2014-08-27 LAB — EPSTEIN-BARR VIRUS VCA, IGG: EBV VCA IgG: 73.6 U/mL — ABNORMAL HIGH (ref <18.0)

## 2014-08-27 LAB — EPSTEIN-BARR VIRUS VCA, IGM: EBV VCA IgM: <10 U/mL (ref <36.0)

## 2014-08-27 MED ORDER — IOHEXOL 300 MG/ML  SOLN
75.0000 mL | Freq: Once | INTRAMUSCULAR | Status: AC | PRN
  Administered 2014-08-27: 75 mL via INTRAVENOUS

## 2014-09-16 ENCOUNTER — Other Ambulatory Visit: Payer: Self-pay | Admitting: Family Medicine

## 2014-10-27 ENCOUNTER — Other Ambulatory Visit: Payer: Self-pay | Admitting: Family Medicine

## 2014-12-25 ENCOUNTER — Encounter: Payer: Self-pay | Admitting: Family Medicine

## 2014-12-25 ENCOUNTER — Ambulatory Visit (INDEPENDENT_AMBULATORY_CARE_PROVIDER_SITE_OTHER): Payer: Commercial Managed Care - PPO | Admitting: Family Medicine

## 2014-12-25 VITALS — BP 110/82 | HR 75 | Temp 98.7°F | Ht 66.0 in | Wt 133.0 lb

## 2014-12-25 DIAGNOSIS — S86892A Other injury of other muscle(s) and tendon(s) at lower leg level, left leg, initial encounter: Secondary | ICD-10-CM

## 2014-12-25 MED ORDER — TRAZODONE HCL 100 MG PO TABS: 100.0000 mg | ORAL_TABLET | Freq: Every day | ORAL | Status: DC

## 2014-12-25 MED ORDER — DICLOFENAC SODIUM 75 MG PO TBEC: 75.0000 mg | DELAYED_RELEASE_TABLET | Freq: Two times a day (BID) | ORAL | Status: DC

## 2014-12-25 NOTE — Progress Notes (Signed)
Pre visit review using our clinic review tool, if applicable. No additional management support is needed unless otherwise documented below in the visit note. 

## 2014-12-26 ENCOUNTER — Encounter: Payer: Self-pay | Admitting: Family Medicine

## 2014-12-26 NOTE — Progress Notes (Signed)
   Subjective:    Patient ID: Leslie Hughes, female    DOB: [birth year], 39 y.o.   MRN: [id number]  HPI Here for what she thinks is shin splints. This involves only the left lower leg. She has aching pains that throb at times and these are centered over the left shin area. The anterior leg will get slightly swollen at times, especially if she is working out or walking long distances. She used to enjoy running but she has not run for over a month. No pain in the calves. Using Ibuprofen and Tylenol.     Review of Systems  Constitutional: Negative.   Respiratory: Negative.   Cardiovascular: Negative.   Musculoskeletal: Positive for joint swelling and arthralgias.  Hematological: Negative.        Objective:   Physical Exam  Constitutional: She appears well-developed and well-nourished.  Cardiovascular: Normal rate, regular rhythm, normal heart sounds and intact distal pulses.   Pulmonary/Chest: Effort normal and breath sounds normal.  Musculoskeletal: She exhibits no edema.  Slightly tender along the left shin. No warmth or erythema. No calf tenderness.           Assessment & Plan:  This does sound like shin splints. She will avoid prolonged standing or running. Use ice packs prn. Use Diclofenac bid. Refer to Sports Medicine for advice since she enjoys exercising every day.

## 2015-01-11 ENCOUNTER — Other Ambulatory Visit: Payer: Self-pay | Admitting: Family Medicine

## 2015-01-12 ENCOUNTER — Ambulatory Visit (INDEPENDENT_AMBULATORY_CARE_PROVIDER_SITE_OTHER)
Admission: RE | Admit: 2015-01-12 | Discharge: 2015-01-12 | Disposition: A | Discharge status: Home / Self Care | Payer: Commercial Managed Care - PPO | Source: Ambulatory Visit | Attending: Family Medicine | Admitting: Family Medicine

## 2015-01-12 ENCOUNTER — Ambulatory Visit (INDEPENDENT_AMBULATORY_CARE_PROVIDER_SITE_OTHER): Payer: Commercial Managed Care - PPO | Admitting: Family Medicine

## 2015-01-12 ENCOUNTER — Other Ambulatory Visit (INDEPENDENT_AMBULATORY_CARE_PROVIDER_SITE_OTHER): Payer: Commercial Managed Care - PPO

## 2015-01-12 ENCOUNTER — Encounter: Payer: Self-pay | Admitting: Family Medicine

## 2015-01-12 VITALS — BP 122/84 | HR 91 | Ht 65.0 in | Wt 132.0 lb

## 2015-01-12 DIAGNOSIS — M25561 Pain in right knee: Secondary | ICD-10-CM

## 2015-01-12 DIAGNOSIS — M25562 Pain in left knee: Secondary | ICD-10-CM

## 2015-01-12 DIAGNOSIS — S86892A Other injury of other muscle(s) and tendon(s) at lower leg level, left leg, initial encounter: Secondary | ICD-10-CM | POA: Diagnosis not present

## 2015-01-12 DIAGNOSIS — E559 Vitamin D deficiency, unspecified: Secondary | ICD-10-CM | POA: Diagnosis not present

## 2015-01-12 MED ORDER — VITAMIN D (ERGOCALCIFEROL) 1.25 MG (50000 UNIT) PO CAPS: 50000.0000 [IU] | ORAL_CAPSULE | ORAL | Status: DC

## 2015-01-12 NOTE — Progress Notes (Signed)
Pre visit review using our clinic review tool, if applicable. No additional management support is needed unless otherwise documented below in the visit note. 

## 2015-01-12 NOTE — Progress Notes (Signed)
[facility] [address] [place], [place] [address] Phone: [phone] Subjective:    I'm seeing this patient by the request  of:  FRY,STEPHEN A, MD   CC: runner with shin pain. Left side worse than right  TSV:[id number] Leslie Hughes is a 39 y.o. female coming in with complaint of  Shin pain. Patient was seen by primary care provider in 3 weeks ago for shin pain. Patient was given anti-inflammatories and was to decrease her running.patient states this started with the left shin area. Seems to be on the anterior aspect of it. Sometimes can be associated with some swelling. Seems to be worse with working out as well as going long distances.patient has not been running but has been playing tennis still. Noted some mild discomfort. Nothing as bad is on it was. Patient states overall she seems to be making some mild improvement but was to make sure before she starts increasing her activity. Rates the severity now has more of 3 out of 10.  Patient is also complaining of right knee pain. Has a past medical history significant for a plica removable 2 times of this knee.PATIENT DESCRIBES THE PAIN AS MORE OF WEAKNESS. fEELS THAT THE ANTERIOR PART OF HER KNEE CRYING FROM TIME TO TIME. sTATES THAT THE MEDIAL ASPECT OF THE KNEE CAN BE SORE. dOES NOT GIVE OUT ON HER. nO RADIATION AND NO NUMBNESS. pATIENT THOUGH WOULD LIKE TO REMAIN ACTIVE AND IS FEELS THAT THIS KNEE IS NOT QUITE AS STABLE AS THE CONTRALATERAL KNEE.     Past Medical History  Diagnosis Date  . Migraine   . Cervical dystonia     sees Dr. Tonye Royalty at [facility]   . Metrorrhagia     sees Dr. Marylynn Pearson   . Allergy   . Vitamin D deficiency    Past Surgical History  Procedure Laterality Date  . Knee surgery  1999 and 2008    arthroscopy to right knee (tennis injuries)  . Cesarean section  2009   Social History  Substance Use Topics  . Smoking status: Never Smoker   . Smokeless tobacco: Never Used   . Alcohol Use: 0.0 oz/week    0 Standard drinks or equivalent per week     Comment: occ   Allergies  Allergen Reactions  . Amoxicillin    Family History  Problem Relation Age of Onset  . Hypothyroidism Mother   . Thyroid disease Mother   . Cancer Father     bladder  . Hyperlipidemia Father   . Hypertension Father   . Skin cancer Paternal Grandmother   . Colon cancer Paternal Grandfather      Past medical history, social, surgical and family history all reviewed in electronic medical record.   Review of Systems: No headache, visual changes, nausea, vomiting, diarrhea, constipation, dizziness, abdominal pain, skin rash, fevers, chills, night sweats, weight loss, swollen lymph nodes, body aches, joint swelling, muscle aches, chest pain, shortness of breath, mood changes.   Objective Blood pressure 122/84, pulse 91, height 5\' 5"  (1.651 m), weight 132 lb (59.875 kg), SpO2 99 %.  General: No apparent distress alert and oriented x3 mood and affect normal, dressed appropriately.  HEENT: Pupils equal, extraocular movements intact  Respiratory: Patient's speak in full sentences and does not appear short of breath  Cardiovascular: No lower extremity edema, non tender, no erythema  Skin: Warm dry intact with no signs of infection or rash on extremities or on axial skeleton.  Abdomen: Soft nontender  Neuro: Cranial nerves II through XII are intact, neurovascularly intact in all extremities with 2+ DTRs and 2+ pulses.  Lymph: No lymphadenopathy of posterior or anterior cervical chain or axillae bilaterally.  Gait normal with good balance and coordination.  MSK:  Non tender with full range of motion and good stability and symmetric strength and tone of shoulders, elbows, wrist, hip,  and ankles bilaterally.  Knee:RIGHT Normal to inspection with no erythema or effusion or obvious bony abnormalities. Tender over the medial joint line ROM full in flexion and extension and lower leg  rotation. Ligaments with solid consistent endpoints including ACL, PCL, LCL, MCL. Negative Mcmurray's, Apley's, and Thessalonian tests. painful patellar compression. Patellar glide with mild crepitus. Patellar and quadriceps tendons unremarkable. Hamstring and quadriceps strength is normal.  Left shin generalized tenderness to palpation mostly distally. No focal findings.  MSK US performed of: right knee and left shift This study was ordered, performed, and interpreted by Charlann Boxer D.O.  Knee:right All structures visualized. Patient has had removal of the posterior medial meniscus with moderate narrowing of the joint space Patellar Tendon unremarkable on long and transverse views without effusion. No abnormality of prepatellar bursa. LCL and MCL unremarkable on long and transverse views. No abnormality of origin of medial or lateral head of the gastrocnemius.  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  This included exercises focusing on stretching, strengthening, with significant focus on eccentric aspects.  Patient given exercises for vastus medialis obliques strengthening as well as quadriceps strengthening. Patient given hamstring stretches as well as hip abductor strengthening. Proper technique shown and discussed handout in great detail with ATC.  All questions were discussed and answered.   X-rays were ordered reviewed and interpreted by me today. X-rays the patient's right knee does show bone demineralization.    Impression and Recommendations:     This case required medical decision making of moderate complexity.

## 2015-01-12 NOTE — Assessment & Plan Note (Addendum)
Multifactorial. I do think that the postsurgical changes likely contributing patient probably does have some early arthritis. X-rays ordered today. We discussed icing regimen and home exercises. Discussed with patient about monitoring vitamin D levels closely. We made to discuss possibly repeat labs in the near future. Patient will do high-dose vitamin D supplementation. Patient will come back and see me again in 3-4 weeks.

## 2015-01-12 NOTE — Patient Instructions (Addendum)
Good to see you.  Ice 20 minutes 2 times daily. Usually after activity and before bed. Iron 65mg  daily Vitamin D 50000 IU weekly for next 3 months.  Exercises 3 times a week alternate the knee Water 10 cups daily Compression socks with activity  pennsaid pinkie amount topically 2 times daily as needed.  See me again in 3-4 weeks.  FOR TRAINER  No high impact no running  OK to do biking, elliptical no running for 1 week.

## 2015-01-13 DIAGNOSIS — S86899A Other injury of other muscle(s) and tendon(s) at lower leg level, unspecified leg, initial encounter: Secondary | ICD-10-CM | POA: Insufficient documentation

## 2015-01-13 NOTE — Assessment & Plan Note (Signed)
Concern with a patient at the age of 72 with osteopenia. We may need to consider further workup including workup for pituitary gland such as sheehan syndrome. We'll discuss how aggressive we should get based on patient's response to conservative therapy.

## 2015-01-13 NOTE — Assessment & Plan Note (Signed)
Likely secondary to more of the vitamin D supplementation of this seems to be nonexistent in this individual who is been diagnosed previously with osteopenia. Patient has been running and did have some overtraining. We discussed proper nutrition as well as slowly given her an exercise prescription. Discussed the importance of compression sleeve and given a trial topical anti-inflammatories. Patient will come back and see me again in 3-4 weeks to make sure she is improving.

## 2015-02-02 ENCOUNTER — Ambulatory Visit: Payer: Commercial Managed Care - PPO | Admitting: Family Medicine

## 2015-02-02 ENCOUNTER — Ambulatory Visit (INDEPENDENT_AMBULATORY_CARE_PROVIDER_SITE_OTHER): Payer: Commercial Managed Care - PPO | Admitting: Family Medicine

## 2015-02-02 ENCOUNTER — Encounter: Payer: Self-pay | Admitting: Family Medicine

## 2015-02-02 ENCOUNTER — Other Ambulatory Visit: Payer: Self-pay | Admitting: Family Medicine

## 2015-02-02 VITALS — BP 118/82 | HR 79 | Ht 65.0 in | Wt 135.0 lb

## 2015-02-02 DIAGNOSIS — S86892D Other injury of other muscle(s) and tendon(s) at lower leg level, left leg, subsequent encounter: Secondary | ICD-10-CM

## 2015-02-02 MED ORDER — DICLOFENAC SODIUM 2 % TD SOLN: TRANSDERMAL | Status: DC

## 2015-02-02 NOTE — Assessment & Plan Note (Signed)
Patient really does have stress fracture at this time. Patient was put in a Cam Walker, icing protocol, continue vitamin D, Advair vitamin C supplementation to her iron supplementation. Discussed avoiding high impact activity for the next 2-3 weeks. Patient come back at that time for further evaluation and treatment.

## 2015-02-02 NOTE — Progress Notes (Signed)
Pre visit review using our clinic review tool, if applicable. No additional management support is needed unless otherwise documented below in the visit note. 

## 2015-02-02 NOTE — Progress Notes (Signed)
[facility] [address] [address], [address] [address] Phone: [phone] Subjective:    I'm seeing this patient by the request  of:  FRY,STEPHEN A, MD   CC: runner with shin pain. Left side worse than right  [id number] Celena Rodberg is a 39 y.o. female coming in with complaint of  Shin pain. Right knee pain. Shin pain and moderate OA of medial compartment with post surgical changes. Patient states that her right knee seems to be stable. Still having some mild anterior knee pain. Was told that she had more of a patellar tendinitis. Been wearing the brace but continues to play tennis, yoga, kickboxing, and working out on a very regular basis. This includes high impact exercises. Patient went one week.  Patient also is complaining of left shin pain. On ultrasound previously was unable to see her at that time or increasing Doppler flow. Patient was treated more for potential tendinitis. Patient was taking one week often and was to increase her activity. Unfortunate the pain seems to be worsening she states. Having a dull throbbing aching pain. Especially with jumping or running. Patient is doing the vitamin D and the iron which has been somewhat helpful.       Past Medical History  Diagnosis Date  . Migraine   . Cervical dystonia     sees Dr. Tonye Royalty at [facility]   . Metrorrhagia     sees Dr. Marylynn Pearson   . Allergy   . Vitamin D deficiency    Past Surgical History  Procedure Laterality Date  . Knee surgery  1999 and 2008    arthroscopy to right knee (tennis injuries)  . Cesarean section  2009   Social History  Substance Use Topics  . Smoking status: Never Smoker   . Smokeless tobacco: Never Used  . Alcohol Use: 0.0 oz/week    0 Standard drinks or equivalent per week     Comment: occ   Allergies  Allergen Reactions  . Amoxicillin    Family History  Problem Relation Age of Onset  . Hypothyroidism Mother   . Thyroid disease Mother     . Cancer Father     bladder  . Hyperlipidemia Father   . Hypertension Father   . Skin cancer Paternal Grandmother   . Colon cancer Paternal Grandfather      Past medical history, social, surgical and family history all reviewed in electronic medical record.   Review of Systems: No headache, visual changes, nausea, vomiting, diarrhea, constipation, dizziness, abdominal pain, skin rash, fevers, chills, night sweats, weight loss, swollen lymph nodes, body aches, joint swelling, muscle aches, chest pain, shortness of breath, mood changes.   Objective Blood pressure 118/82, pulse 79, height 5\' 5"  (1.651 m), weight 135 lb (61.236 kg), SpO2 94 %.  General: No apparent distress alert and oriented x3 mood and affect normal, dressed appropriately.  HEENT: Pupils equal, extraocular movements intact  Respiratory: Patient's speak in full sentences and does not appear short of breath  Cardiovascular: No lower extremity edema, non tender, no erythema  Skin: Warm dry intact with no signs of infection or rash on extremities or on axial skeleton.  Abdomen: Soft nontender  Neuro: Cranial nerves II through XII are intact, neurovascularly intact in all extremities with 2+ DTRs and 2+ pulses.  Lymph: No lymphadenopathy of posterior or anterior cervical chain or axillae bilaterally.  Gait normal with good balance and coordination.  MSK:  Non tender with full range of  motion and good stability and symmetric strength and tone of shoulders, elbows, wrist, hip,  and ankles bilaterally.  Knee:RIGHT Normal to inspection with no erythema or effusion or obvious bony abnormalities. Tender over the medial joint line ROM full in flexion and extension and lower leg rotation. Ligaments with solid consistent endpoints including ACL, PCL, LCL, MCL. Negative Mcmurray's, Apley's, and Thessalonian tests. painful patellar compression. Patellar glide with mild crepitus. Patellar and quadriceps tendons  unremarkable. Hamstring and quadriceps strength is normal.  Left shin patient is tender to palpation over the medial aspect of the tibia distally as well as to percussion. This is worse than previous exam.  MSK US performed of: right knee and left shift This study was ordered, performed, and interpreted by Charlann Boxer D.O.  Knee: left Patient was found to have an thickening of the distal third of the left shin with hypoechoic changes and severe increasing in Doppler flow. Consistent with a possible stress fracture  IMPRESSION:  Her of left shin distally.    X-rays were ordered reviewed and interpreted by me today. X-rays the patient's right knee does show bone demineralization.    Impression and Recommendations:     This case required medical decision making of moderate complexity.

## 2015-02-02 NOTE — Patient Instructions (Addendum)
Good to see you Ice is your friend Cam walker daily in and out of the house for next 2 weeks Continue the vitamin D Wear strap on knee OK to lift if feet are planted No running or jumping of any sort including tennis for next 2 weeks.  See me after holidays.

## 2015-02-05 ENCOUNTER — Other Ambulatory Visit: Payer: Self-pay | Admitting: Family Medicine

## 2015-02-05 ENCOUNTER — Telehealth: Payer: Self-pay

## 2015-02-05 NOTE — Telephone Encounter (Signed)
Refill for 6 months. 

## 2015-02-05 NOTE — Telephone Encounter (Signed)
We answered an electronic request for this

## 2015-02-05 NOTE — Telephone Encounter (Signed)
Pt requesting Temazepam 30mg  Pt last visit: 12/25/14 Last Rx refill: 07/14/14 #30 with 5 refills

## 2015-02-17 ENCOUNTER — Encounter: Payer: Self-pay | Admitting: Family Medicine

## 2015-02-17 ENCOUNTER — Other Ambulatory Visit: Payer: Commercial Managed Care - PPO

## 2015-02-17 ENCOUNTER — Ambulatory Visit (INDEPENDENT_AMBULATORY_CARE_PROVIDER_SITE_OTHER): Payer: Commercial Managed Care - PPO | Admitting: Family Medicine

## 2015-02-17 ENCOUNTER — Ambulatory Visit (INDEPENDENT_AMBULATORY_CARE_PROVIDER_SITE_OTHER)
Admission: RE | Admit: 2015-02-17 | Discharge: 2015-02-17 | Disposition: A | Discharge status: Home / Self Care | Payer: Commercial Managed Care - PPO | Source: Ambulatory Visit | Attending: Family Medicine | Admitting: Family Medicine

## 2015-02-17 VITALS — BP 112/80 | HR 90 | Ht 65.0 in | Wt 135.0 lb

## 2015-02-17 DIAGNOSIS — S86892A Other injury of other muscle(s) and tendon(s) at lower leg level, left leg, initial encounter: Secondary | ICD-10-CM | POA: Diagnosis not present

## 2015-02-17 NOTE — Assessment & Plan Note (Signed)
Patient still having the same amount of tenderness in the area. We discussed icing regimen and home exercises. We discussed which activities to do an which ones to avoid. Patient is on a try to increase her activity given an exercise prescription. Patient will then slowly start increasing her activity. If any worsening symptoms she no status continuing come back immediately. I would like to avoid an MRI but we do not see anything on ultrasound or x-ray today to give [place] further understanding of why she is having this pain. Denies any back pain that is associated with it. Patient will come back again in 4 weeks to make sure completely resolved.  Spent  25 minutes with patient face-to-face and had greater than 50% of counseling including as described above in assessment and plan.

## 2015-02-17 NOTE — Patient Instructions (Signed)
Good to se you Happy holidays! Lets get a couple xrays today to make sure all is ok Go to town Would try to do a little less high impact when you can Send me a message in 10 days and if not better or worse we may want to consider a MRI, but you should be good Vitamin D 2000 IU daily after the once weekly.

## 2015-02-17 NOTE — Progress Notes (Signed)
[facility] [place], [place] [address] Phone: [phone] Subjective:    I'm seeing this patient by the request  of:  FRY,STEPHEN A, MD   CC: runner with shin pain. Left side worse than right  [id number] Leslie Hughes is a 39 y.o. female coming in with complaint of  Shin pain. Patient was seen previously and was diagnosed with a possible stress reaction. States that she has been wearing the [employer] fairly recently. Has been playing tennis occasionally. Still some mild discomfort on the side. Patient is wondering if this is her new baseline. More of a soreness centrally pain.      Past Medical History  Diagnosis Date  . Migraine   . Cervical dystonia     sees Dr. Tonye Royalty at [facility]   . Metrorrhagia     sees Dr. Marylynn Pearson   . Allergy   . Vitamin D deficiency    Past Surgical History  Procedure Laterality Date  . Knee surgery  1999 and 2008    arthroscopy to right knee (tennis injuries)  . Cesarean section  2009   Social History  Substance Use Topics  . Smoking status: Never Smoker   . Smokeless tobacco: Never Used  . Alcohol Use: 0.0 oz/week    0 Standard drinks or equivalent per week     Comment: occ   Allergies  Allergen Reactions  . Amoxicillin    Family History  Problem Relation Age of Onset  . Hypothyroidism Mother   . Thyroid disease Mother   . Cancer Father     bladder  . Hyperlipidemia Father   . Hypertension Father   . Skin cancer Paternal Grandmother   . Colon cancer Paternal Grandfather      Past medical history, social, surgical and family history all reviewed in electronic medical record.   Review of Systems: No headache, visual changes, nausea, vomiting, diarrhea, constipation, dizziness, abdominal pain, skin rash, fevers, chills, night sweats, weight loss, swollen lymph nodes, body aches, joint swelling, muscle aches, chest pain, shortness of breath, mood changes.    Objective Blood pressure 112/80, pulse 90, height 5\' 5"  (1.651 m), weight 135 lb (61.236 kg), last menstrual period 01/27/2015, SpO2 99 %.  General: No apparent distress alert and oriented x3 mood and affect normal, dressed appropriately.  HEENT: Pupils equal, extraocular movements intact  Respiratory: Patient's speak in full sentences and does not appear short of breath  Cardiovascular: No lower extremity edema, non tender, no erythema  Skin: Warm dry intact with no signs of infection or rash on extremities or on axial skeleton.  Abdomen: Soft nontender  Neuro: Cranial nerves II through XII are intact, neurovascularly intact in all extremities with 2+ DTRs and 2+ pulses.  Lymph: No lymphadenopathy of posterior or anterior cervical chain or axillae bilaterally.  Gait normal with good balance and coordination.  MSK:  Non tender with full range of motion and good stability and symmetric strength and tone of shoulders, elbows, wrist, hip,  and ankles bilaterally.  Knee:RIGHT Normal to inspection with no erythema or effusion or obvious bony abnormalities. Tender over the medial joint line ROM full in flexion and extension and lower leg rotation. Ligaments with solid consistent endpoints including ACL, PCL, LCL, MCL. Negative Mcmurray's, Apley's, and Thessalonian tests. painful patellar compression. Patellar glide with mild crepitus. Patellar and quadriceps tendons unremarkable. Hamstring and quadriceps strength is normal.  Left shin patient is tender to palpation over the medial aspect  of the tibia distally as well as to percussion. This is worse than previous exam.  MSK US performed of: right knee and left shift This study was ordered, performed, and interpreted by Charlann Boxer D.O.  Knee: left Thickening that was present on the shin previously has completely resolved at this time.  IMPRESSION:  No stress fracture noted    X-rays were ordered reviewed and interpreted by me today.  X-rays the patient's right knee does show bone demineralization. Otherwise unremarkable. No signs or symptoms of anything of the ankle otherwise.    Impression and Recommendations:     This case required medical decision making of moderate complexity.

## 2015-02-17 NOTE — Progress Notes (Signed)
Pre visit review using our clinic review tool, if applicable. No additional management support is needed unless otherwise documented below in the visit note. 

## 2015-05-14 ENCOUNTER — Ambulatory Visit: Payer: Commercial Managed Care - PPO | Admitting: Family Medicine

## 2015-05-25 ENCOUNTER — Other Ambulatory Visit (INDEPENDENT_AMBULATORY_CARE_PROVIDER_SITE_OTHER): Payer: Commercial Managed Care - PPO

## 2015-05-25 ENCOUNTER — Encounter: Payer: Self-pay | Admitting: Family Medicine

## 2015-05-25 ENCOUNTER — Ambulatory Visit (INDEPENDENT_AMBULATORY_CARE_PROVIDER_SITE_OTHER): Payer: Commercial Managed Care - PPO | Admitting: Family Medicine

## 2015-05-25 VITALS — BP 118/80 | HR 77 | Ht 65.0 in | Wt 138.0 lb

## 2015-05-25 DIAGNOSIS — S86892D Other injury of other muscle(s) and tendon(s) at lower leg level, left leg, subsequent encounter: Secondary | ICD-10-CM | POA: Diagnosis not present

## 2015-05-25 DIAGNOSIS — M25562 Pain in left knee: Secondary | ICD-10-CM | POA: Diagnosis not present

## 2015-05-25 DIAGNOSIS — E559 Vitamin D deficiency, unspecified: Secondary | ICD-10-CM | POA: Diagnosis not present

## 2015-05-25 DIAGNOSIS — M25561 Pain in right knee: Secondary | ICD-10-CM | POA: Diagnosis not present

## 2015-05-25 MED ORDER — VITAMIN D (ERGOCALCIFEROL) 1.25 MG (50000 UNIT) PO CAPS: 50000.0000 [IU] | ORAL_CAPSULE | ORAL | Status: DC

## 2015-05-25 NOTE — Assessment & Plan Note (Addendum)
Leg continued to not see any significant derangement of the knee at this time. Patient though states that there is some mechanical disruption of the knee from time to time. Patient and the brace today. Injection given. We discussed the possibility of advanced imaging if this continues. Patient will continue with conservative therapy including home exercises for the next 4 weeks and come back for further evaluation. If worsening symptoms advance imaging may be warranted especially mechanical symptoms persist.

## 2015-05-25 NOTE — Assessment & Plan Note (Signed)
Patient was improving and then has discontinued the vitamin D over the last month and worsening symptoms again. Patient will start the once weekly again for another 12 weeks.

## 2015-05-25 NOTE — Patient Instructions (Signed)
Good to see you  We will start the vitamin D again weekly for another 12 weeks.  Ice is your friend after activity  Wear brace with training High impact exercises only 3 times a week with a off day in between Exercises on wall.  Heel and butt touching.  Raise leg 6 inches and hold 2 seconds.  Down slow for count of 4 seconds.  1 set of 30 reps daily on both sides.  See me again in 3-4 weeks.

## 2015-05-25 NOTE — Progress Notes (Signed)
[name] [place], [place] [address] Phone: [phone] Subjective:    I'm seeing this patient by the request  of:  FRY,STEPHEN A, MD   CC: Right knee pain follow-up  [id number] Leslie [facility] is a 40 y.o. female coming in with right knee pain. Patient was last seen greater than 3 months ago. Patient is an avid runner and was found to have a medial stress syndrome of the tibia on the contralateral side that seems to be doing well. Patient continues to have right knee pain. Patient did have surgery On this knee previously.Patient is been training and wants to be doing a Trail race in 2 months time. Feels like she would not be able to do so at this time. Working with a Clinical research associate for the last 3 months. Also doing Pilates 5 times a week. States that she feels that this knee is stopping her from certain activities. Trying to stay active. Was doing better when she was taking vitamin D and has discontinued it recently. States that sometimes it feels like her knee is mechanically stuck. Has to sometimes use her hands to move it the full range of motion. Denies any radiation down the leg. Has never given out on her.      Past Medical History  Diagnosis Date  . Migraine   . Cervical dystonia     sees Dr. Tonye Royalty at [facility]   . Metrorrhagia     sees Dr. Marylynn Pearson   . Allergy   . Vitamin D deficiency    Past Surgical History  Procedure Laterality Date  . Knee surgery  1999 and 2008    arthroscopy to right knee (tennis injuries)  . Cesarean section  2009   Social History  Substance Use Topics  . Smoking status: Never Smoker   . Smokeless tobacco: Never Used  . Alcohol Use: 0.0 oz/week    0 Standard drinks or equivalent per week     Comment: occ   Allergies  Allergen Reactions  . Amoxicillin    Family History  Problem Relation Age of Onset  . Hypothyroidism Mother   . Thyroid disease Mother   . Cancer Father     bladder  .  Hyperlipidemia Father   . Hypertension Father   . Skin cancer Paternal Grandmother   . Colon cancer Paternal Grandfather    X-rays were ordered reviewed and interpreted by me today. X-rays the patient's right knee does show bone demineralization.   Past medical history, social, surgical and family history all reviewed in electronic medical record.   Review of Systems: No headache, visual changes, nausea, vomiting, diarrhea, constipation, dizziness, abdominal pain, skin rash, fevers, chills, night sweats, weight loss, swollen lymph nodes, body aches, joint swelling, muscle aches, chest pain, shortness of breath, mood changes.   Objective Blood pressure 118/80, pulse 77, height 5\' 5"  (1.651 m), weight 138 lb (62.596 kg), SpO2 96 %.  General: No apparent distress alert and oriented x3 mood and affect normal, dressed appropriately.  HEENT: Pupils equal, extraocular movements intact  Respiratory: Patient's speak in full sentences and does not appear short of breath  Cardiovascular: No lower extremity edema, non tender, no erythema  Skin: Warm dry intact with no signs of infection or rash on extremities or on axial skeleton.  Abdomen: Soft nontender  Neuro: Cranial nerves II through XII are intact, neurovascularly intact in all extremities with 2+ DTRs and 2+ pulses.  Lymph: No lymphadenopathy of  posterior or anterior cervical chain or axillae bilaterally.  Gait normal with good balance and coordination.  MSK:  Non tender with full range of motion and good stability and symmetric strength and tone of shoulders, elbows, wrist, hip,  and ankles bilaterally.  Knee:RIGHT Normal to inspection with no erythema or effusion or obvious bony abnormalities. Tender over the medial joint line ROM full in flexion and extension and lower leg rotation. Ligaments with solid consistent endpoints including ACL, PCL, LCL, MCL. Mild positive McMurray's which is a new finding. painful patellar  compression. Patellar glide with mild crepitus. Patellar and quadriceps tendons unremarkable. Hamstring and quadriceps strength is normal.    MSK US performed of: Right knee This study was ordered, performed, and interpreted by Charlann Boxer D.O.  Knee: All structures visualized. Mild narrowing of the medial joint space as well as post changes of the medial meniscus. Patellar Tendon unremarkable on long and transverse views without effusion. No abnormality of prepatellar bursa. LCL and MCL unremarkable on long and transverse views. No abnormality of origin of medial or lateral head of the gastrocnemius.  IMPRESSION: Post surgical changes of the medial meniscus otherwise fairly unremarkable    After informed written and verbal consent, patient was seated on exam table. Right knee was prepped with alcohol swab and utilizing anterolateral approach, patient's right knee space was injected with 4:1  marcaine 0.5%: Kenalog 40mg /dL. Patient tolerated the procedure well without immediate complications.     Impression and Recommendations:     This case required medical decision making of moderate complexity.

## 2015-05-25 NOTE — Assessment & Plan Note (Signed)
I think patient is having some compensation. We'll continue with the vitamin D supplementation. Patient will continue compression. Hopefully within the improving patient will improve as well. We'll check again in 4 weeks.

## 2015-05-25 NOTE — Progress Notes (Signed)
Pre visit review using our clinic review tool, if applicable. No additional management support is needed unless otherwise documented below in the visit note. 

## 2015-06-08 ENCOUNTER — Other Ambulatory Visit: Payer: Self-pay | Admitting: Family Medicine

## 2015-06-15 ENCOUNTER — Ambulatory Visit: Payer: Commercial Managed Care - PPO | Admitting: Family Medicine

## 2015-06-23 ENCOUNTER — Ambulatory Visit (INDEPENDENT_AMBULATORY_CARE_PROVIDER_SITE_OTHER)
Admission: RE | Admit: 2015-06-23 | Discharge: 2015-06-23 | Disposition: A | Discharge status: Home / Self Care | Payer: Commercial Managed Care - PPO | Source: Ambulatory Visit | Attending: Family Medicine | Admitting: Family Medicine

## 2015-06-23 ENCOUNTER — Ambulatory Visit (INDEPENDENT_AMBULATORY_CARE_PROVIDER_SITE_OTHER): Payer: Commercial Managed Care - PPO | Admitting: Family Medicine

## 2015-06-23 ENCOUNTER — Encounter: Payer: Self-pay | Admitting: Family Medicine

## 2015-06-23 VITALS — BP 118/78 | HR 75 | Ht 65.0 in | Wt 133.0 lb

## 2015-06-23 DIAGNOSIS — M25561 Pain in right knee: Secondary | ICD-10-CM

## 2015-06-23 DIAGNOSIS — M2170 Unequal limb length (acquired), unspecified site: Secondary | ICD-10-CM | POA: Diagnosis not present

## 2015-06-23 DIAGNOSIS — S86892D Other injury of other muscle(s) and tendon(s) at lower leg level, left leg, subsequent encounter: Secondary | ICD-10-CM

## 2015-06-23 NOTE — Patient Instructions (Signed)
Good to see you  Overall we are making improvement Xray downstairs today  Ice is your friend after activity  Continue the vitamins Push it and good luck in your tournament.  See me again in 3 weeks and if not better we will discuss monovisc or orthovisc or MRI.

## 2015-06-23 NOTE — Progress Notes (Signed)
[name] [place], [place] [address] Phone: [phone] Subjective:    I'm seeing this patient by the request  of:  FRY,STEPHEN A, MD   CC: Right knee pain follow-up  [id number] Leslie Hughes is a 40 y.o. female coming in with right knee pain. Patient was seen one month ago and was having more knee pain. It seemed the patient had more postsurgical medial meniscal pain. Patient was given an injection. Patient was to do home exercises, icing, patient statesShe is making some improvement. Patient states after the injection she is feeling 90% better for one week and now only approximately 60% better. States that there is some times when she is at a certain amount of flexion of the knee and feels like it might when he give out on her. Worse with going up and down hill still. Still on the anterior medial aspect of the knee. No swelling noted.      Past Medical History  Diagnosis Date  . Migraine   . Cervical dystonia     sees Dr. Tonye Royalty at [facility]   . Metrorrhagia     sees Dr. Marylynn Pearson   . Allergy   . Vitamin D deficiency    Past Surgical History  Procedure Laterality Date  . Knee surgery  1999 and 2008    arthroscopy to right knee (tennis injuries)  . Cesarean section  2009   Social History  Substance Use Topics  . Smoking status: Never Smoker   . Smokeless tobacco: Never Used  . Alcohol Use: 0.0 oz/week    0 Standard drinks or equivalent per week     Comment: occ   Allergies  Allergen Reactions  . Amoxicillin    Family History  Problem Relation Age of Onset  . Hypothyroidism Mother   . Thyroid disease Mother   . Cancer Father     bladder  . Hyperlipidemia Father   . Hypertension Father   . Skin cancer Paternal Grandmother   . Colon cancer Paternal Grandfather    X-rays were ordered reviewed and interpreted by me today. X-rays the patient's right knee does show bone demineralization.   Past medical  history, social, surgical and family history all reviewed in electronic medical record.   Review of Systems: No headache, visual changes, nausea, vomiting, diarrhea, constipation, dizziness, abdominal pain, skin rash, fevers, chills, night sweats, weight loss, swollen lymph nodes, body aches, joint swelling, muscle aches, chest pain, shortness of breath, mood changes.   Objective Blood pressure 118/78, pulse 75, height 5\' 5"  (1.651 m), weight 133 lb (60.328 kg), SpO2 99 %.  General: No apparent distress alert and oriented x3 mood and affect normal, dressed appropriately.  HEENT: Pupils equal, extraocular movements intact  Respiratory: Patient's speak in full sentences and does not appear short of breath  Cardiovascular: No lower extremity edema, non tender, no erythema  Skin: Warm dry intact with no signs of infection or rash on extremities or on axial skeleton.  Abdomen: Soft nontender  Neuro: Cranial nerves II through XII are intact, neurovascularly intact in all extremities with 2+ DTRs and 2+ pulses.  Lymph: No lymphadenopathy of posterior or anterior cervical chain or axillae bilaterally.  Gait normal with good balance and coordination.  MSK:  Non tender with full range of motion and good stability and symmetric strength and tone of shoulders, elbows, wrist, hip,  and ankles bilaterally.  Knee:RIGHT Normal to inspection with no erythema or effusion or obvious  bony abnormalities. Tender over the medial joint line ROM full in flexion and extension and lower leg rotation. Ligaments with solid consistent endpoints including ACL, PCL, LCL, MCL. Mild positive McMurray's still present. painful patellar compression. Patellar glide with mild crepitus. Patellar and quadriceps tendons unremarkable. Hamstring and quadriceps strength is normal.  Contralateral knee unremarkable Leg length discrepancy found      Impression and Recommendations:     This case required medical decision making  of moderate complexity.

## 2015-06-23 NOTE — Assessment & Plan Note (Signed)
Patient continues to have right knee pain. I do think that there is a likelihood for patient having an intra-articular pathology that is contribute in. Repeat x-rays ordered today. Patient does not want to try any other medications that we've discussed previously. We discussed icing regimen. With patient having a leg length discrepancy she will get a heel lift him to left shoe. Patient will come back again in 3 weeks. If worsening symptoms she could be a candidate for viscous supplementation first possible MRI for further evaluation. Patient will consider both of these options and we will discuss at follow-up.

## 2015-06-23 NOTE — Assessment & Plan Note (Signed)
Mild increase in pain whenever she stands to be running. I think this is more contributed to the leg length discrepancy. Patient try a heel lift and hopefully that this will continue to improve.

## 2015-06-23 NOTE — Assessment & Plan Note (Signed)
Patient will try heel lift and we'll see this make improvement.

## 2015-06-23 NOTE — Progress Notes (Signed)
Pre visit review using our clinic review tool, if applicable. No additional management support is needed unless otherwise documented below in the visit note. 

## 2015-07-14 ENCOUNTER — Encounter: Payer: Self-pay | Admitting: Family Medicine

## 2015-07-14 ENCOUNTER — Ambulatory Visit (INDEPENDENT_AMBULATORY_CARE_PROVIDER_SITE_OTHER): Payer: Commercial Managed Care - PPO | Admitting: Family Medicine

## 2015-07-14 VITALS — BP 108/78 | HR 75 | Ht 65.0 in | Wt 133.0 lb

## 2015-07-14 DIAGNOSIS — S86892D Other injury of other muscle(s) and tendon(s) at lower leg level, left leg, subsequent encounter: Secondary | ICD-10-CM | POA: Diagnosis not present

## 2015-07-14 DIAGNOSIS — M25561 Pain in right knee: Secondary | ICD-10-CM | POA: Diagnosis not present

## 2015-07-14 DIAGNOSIS — M2170 Unequal limb length (acquired), unspecified site: Secondary | ICD-10-CM

## 2015-07-14 NOTE — Progress Notes (Signed)
[name] [address] [place], [place] [address] Phone: [phone] Subjective:    I'm seeing this patient by the request  of:  Leslie A, MD   CC: Right knee pain follow-up  [id number] Leslie Hughes is Hughes 40 y.o. female coming in with right knee pain. Patient was seen one month ago and was having more knee pain. It seemed the patient had more postsurgical medial meniscal pain. Patient was given an injection. Patient was to do home exercises, icing, patient states unfortunately she feels like she is worsening. Starting to have more instability. Has Hughes sharp pain that seems to almost have her knee give out. Patient states that she is actually fallen twice over the course last 2 weeks. Patient feels like there is more instability than she's had previously. Feels like she has worsening even with conservative therapy.  Because of this as well patient is starting to have more of the pain on the left tibia that she was having previously. Did not get Hughes heel lift. Continues with the compression. Working on Hughes very regular basis. No new injury      Past Medical History  Diagnosis Date  . Migraine   . Cervical dystonia     sees Dr. Tonye Royalty at [facility]   . Metrorrhagia     sees Dr. Marylynn Pearson   . Allergy   . Vitamin D deficiency    Past Surgical History  Procedure Laterality Date  . Knee surgery  1999 and 2008    arthroscopy to right knee (tennis injuries)  . Cesarean section  2009   Social History  Substance Use Topics  . Smoking status: Never Smoker   . Smokeless tobacco: Never Used  . Alcohol Use: 0.0 oz/week    0 Standard drinks or equivalent per week     Comment: occ   Allergies  Allergen Reactions  . Amoxicillin    Family History  Problem Relation Age of Onset  . Hypothyroidism Mother   . Thyroid disease Mother   . Cancer Father     bladder  . Hyperlipidemia Father   . Hypertension Father   . Skin cancer Paternal  Grandmother   . Colon cancer Paternal Grandfather    X-rays were ordered reviewed and interpreted by me today. X-rays the patient's right knee does show bone demineralization.   Past medical history, social, surgical and family history all reviewed in electronic medical record.   Review of Systems: No headache, visual changes, nausea, vomiting, diarrhea, constipation, dizziness, abdominal pain, skin rash, fevers, chills, night sweats, weight loss, swollen lymph nodes, body aches, joint swelling, muscle aches, chest pain, shortness of breath, mood changes.   Objective Blood pressure 108/78, pulse 75, height 5\' 5"  (1.651 m), weight 133 lb (60.328 kg), last menstrual period 06/16/2015, SpO2 99 %.  General: No apparent distress alert and oriented x3 mood and affect normal, dressed appropriately.  HEENT: Pupils equal, extraocular movements intact  Respiratory: Patient's speak in full sentences and does not appear short of breath  Cardiovascular: No lower extremity edema, non tender, no erythema  Skin: Warm dry intact with no signs of infection or rash on extremities or on axial skeleton.  Abdomen: Soft nontender  Neuro: Cranial nerves II through XII are intact, neurovascularly intact in all extremities with 2+ DTRs and 2+ pulses.  Lymph: No lymphadenopathy of posterior or anterior cervical chain or axillae bilaterally.  Gait normal with good balance and coordination.  MSK:  Non tender  with full range of motion and good stability and symmetric strength and tone of shoulders, elbows, wrist, hip,  and ankles bilaterally.  Knee:RIGHT Normal to inspection with no erythema or effusion or obvious bony abnormalities. Tender over the medial joint line worse than previous exam ROM full in flexion and extension and lower leg rotation. Ligaments with solid consistent endpoints including ACL, PCL, LClL, MCL. positive McMurray's still present.worse than previous exam severely positive [place] with audible  popping sound painful patellar compression. Patellar glide with mild crepitus. Patellar and quadriceps tendons unremarkable. Hamstring and quadriceps strength is normal.  Contralateral knee unremarkable Leg length discrepancy foundright leg still shortened and left leg by Hughes eighth of an inch      Impression and Recommendations:     This case required medical decision making of moderate complexity.

## 2015-07-14 NOTE — Assessment & Plan Note (Signed)
Patient does have more of a stress reaction noted. We discussed icing regimen, home exercises, which activities to do in which ones to avoid. This point we will likely say that this is getting worsening very to compensate for her knee pain. Once we know what is going on with any we will consider further treatment options.

## 2015-07-14 NOTE — Assessment & Plan Note (Signed)
Encourage Leslie Hughes's

## 2015-07-14 NOTE — Progress Notes (Signed)
Pre visit review using our clinic review tool, if applicable. No additional management support is needed unless otherwise documented below in the visit note. 

## 2015-07-14 NOTE — Assessment & Plan Note (Signed)
Discussed with patient at great length. We discussed different treatment options. We discussed possibly repeating formal physical therapy. With patient though having significant instability I do think at this time that advance imaging is warranted. Depending on findings this could change medical management. 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 Patient is having more pain from any recurrent meniscal tear and possible surgical intervention will be necessary. Patient would like to hold on any surgical intervention until fall. Discussed with patient depending on findings we'll discuss what other temporizing measures we can do.  Spent  25 minutes with patient face-to-face and had greater than 50% of counseling including as described above in assessment and plan.

## 2015-07-14 NOTE — Patient Instructions (Signed)
Good to see you  Leslie Hughes is your friend Stay active but listen to your body MRI of right knee will see what is going on If arthritis then will do injections If meniscal tear will need to discuss surgery and can do injections to buy time.  Heal lift in right shoe.  We will talk soon!

## 2015-07-16 ENCOUNTER — Encounter: Payer: Self-pay | Admitting: Family Medicine

## 2015-07-16 ENCOUNTER — Ambulatory Visit (INDEPENDENT_AMBULATORY_CARE_PROVIDER_SITE_OTHER): Payer: Commercial Managed Care - PPO | Admitting: Family Medicine

## 2015-07-16 VITALS — BP 107/83 | HR 78 | Temp 99.2°F | Ht 65.0 in | Wt 132.0 lb

## 2015-07-16 DIAGNOSIS — G43909 Migraine, unspecified, not intractable, without status migrainosus: Secondary | ICD-10-CM | POA: Diagnosis not present

## 2015-07-16 MED ORDER — TIZANIDINE HCL 2 MG PO TABS: ORAL_TABLET | ORAL | Status: DC

## 2015-07-16 MED ORDER — RIZATRIPTAN BENZOATE 10 MG PO TABS: ORAL_TABLET | ORAL | Status: DC

## 2015-07-16 MED ORDER — NONFORMULARY OR COMPOUNDED ITEM
Status: DC
Start: 2015-07-16 — End: 2018-07-25

## 2015-07-16 NOTE — Progress Notes (Signed)
   Subjective:    Patient ID: Leslie Hughes, female    DOB: [birth year], 40 y.o.   MRN: [id number]  HPI Here asking for help with her migraines. She has a long hx of frequent migraines and she has tried numerous preventive medications, but they all have side effects and make her feel terrible. The only thing that she ofund to help her was progesterone cream. She used this for several years and the frequency of her migraines was quite manageable. However one year ago her GYN decided to stop this, and [name] has been struggling with 3 or 4 migraines a week now. Often Maxalt does not help so she has to go to bed and miss a day of work before the HA stops. She would [place] to write for the progesterone cream again. She is thinking about trying to get pregnant again, and she plans to start seeing Dr. Clarice Pole, a high risk Ob specialist in [facility].    Review of Systems  Constitutional: Negative.   Respiratory: Negative.   Cardiovascular: Negative.   Neurological: Positive for headaches.       Objective:   Physical Exam  Constitutional: She is oriented to person, place, and time. She appears well-developed and well-nourished.  Cardiovascular: Normal rate, regular rhythm, normal heart sounds and intact distal pulses.   Pulmonary/Chest: Effort normal and breath sounds normal.  Neurological: She is alert and oriented to person, place, and time.          Assessment & Plan:  Migraines. I agreed to get her back on the progesterone cream for now. She will see what her new ObGYN has to say about this plan.  Laurey Morale, MD

## 2015-07-16 NOTE — Progress Notes (Signed)
Pre visit review using our clinic review tool, if applicable. No additional management support is needed unless otherwise documented below in the visit note. 

## 2015-07-21 ENCOUNTER — Ambulatory Visit
Admission: RE | Admit: 2015-07-21 | Discharge: 2015-07-21 | Disposition: A | Discharge status: Home / Self Care | Payer: Commercial Managed Care - PPO | Source: Ambulatory Visit | Attending: Family Medicine | Admitting: Family Medicine

## 2015-07-21 DIAGNOSIS — M25561 Pain in right knee: Secondary | ICD-10-CM

## 2015-07-22 ENCOUNTER — Telehealth: Payer: Self-pay | Admitting: Family Medicine

## 2015-07-22 NOTE — Telephone Encounter (Signed)
Would like to schedule an injection with Dr. Tamala Julian.  Would like a follow up call before scheduling to ensure insurance will cover.

## 2015-07-23 ENCOUNTER — Encounter: Payer: Self-pay | Admitting: *Deleted

## 2015-07-23 NOTE — Telephone Encounter (Signed)
Discussed with pt, advised her she will need to double check with her insurance to see if injections will be covered. She requested I send her the info on the injections via mychart. msg sent.

## 2015-08-05 ENCOUNTER — Encounter: Payer: Self-pay | Admitting: Family Medicine

## 2015-08-14 ENCOUNTER — Encounter: Payer: Self-pay | Admitting: Family Medicine

## 2015-08-27 ENCOUNTER — Encounter: Payer: Self-pay | Admitting: Family Medicine

## 2015-08-30 ENCOUNTER — Other Ambulatory Visit: Payer: Self-pay | Admitting: Family Medicine

## 2015-09-02 ENCOUNTER — Encounter: Payer: Self-pay | Admitting: Family Medicine

## 2015-09-07 ENCOUNTER — Telehealth: Payer: Self-pay

## 2015-09-07 NOTE — Telephone Encounter (Signed)
Rescheduled appt for Thursday at 11:45am

## 2015-09-10 ENCOUNTER — Ambulatory Visit: Payer: Self-pay | Admitting: Family Medicine

## 2015-09-10 ENCOUNTER — Ambulatory Visit (INDEPENDENT_AMBULATORY_CARE_PROVIDER_SITE_OTHER): Payer: Commercial Managed Care - PPO | Admitting: Family Medicine

## 2015-09-10 ENCOUNTER — Encounter: Payer: Self-pay | Admitting: Family Medicine

## 2015-09-10 VITALS — BP 114/78 | HR 95 | Ht 65.0 in | Wt 131.0 lb

## 2015-09-10 DIAGNOSIS — M1711 Unilateral primary osteoarthritis, right knee: Secondary | ICD-10-CM | POA: Diagnosis not present

## 2015-09-10 DIAGNOSIS — S86892D Other injury of other muscle(s) and tendon(s) at lower leg level, left leg, subsequent encounter: Secondary | ICD-10-CM

## 2015-09-10 DIAGNOSIS — M2170 Unequal limb length (acquired), unspecified site: Secondary | ICD-10-CM

## 2015-09-10 DIAGNOSIS — E559 Vitamin D deficiency, unspecified: Secondary | ICD-10-CM

## 2015-09-10 HISTORY — DX: Unilateral primary osteoarthritis, right knee: M17.11

## 2015-09-10 NOTE — Patient Instructions (Signed)
Good to see you  Ice is your friend, may need it in 6 hours.  We will repeat next week if possible or in 2 weeks.  Keep doing everything else Hopefully this helps

## 2015-09-10 NOTE — Assessment & Plan Note (Signed)
Encouraged supplementation regularly.

## 2015-09-10 NOTE — Assessment & Plan Note (Signed)
Continue heel lift

## 2015-09-10 NOTE — Progress Notes (Signed)
Pre visit review using our clinic review tool, if applicable. No additional management support is needed unless otherwise documented below in the visit note. 

## 2015-09-10 NOTE — Assessment & Plan Note (Signed)
We'll monitor but would like patient to improve with her having improved pain in the knee.

## 2015-09-10 NOTE — Progress Notes (Signed)
[name] [address] [place], [place] [address] Phone: [phone] Subjective:    I'm seeing this patient by the request  of:  FRY,STEPHEN A, MD   CC: Right knee pain follow-up  [id number] Leslie Hughes is a 40 y.o. female coming in with right knee pain. Patient was seen one month ago and was having more knee pain. It seemed the patient had more postsurgical medial meniscal pain. Patient was given an injection. Patient was to do home exercises, icing, patient states unfortunately she feels like she Was worsening. She was sent for an MRI. 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 Patient is failed all conservative therapy including formal physical therapy. Patient failed the steroid injection. At this time patient is looking for other treatment options. States that the more she compensates for this knee for more she is having pain of the left tibia as well.      Past Medical History  Diagnosis Date  . Migraine   . Cervical dystonia     sees Dr. Tonye Royalty at [facility]   . Metrorrhagia     sees Dr. Marylynn Pearson   . Allergy   . Vitamin D deficiency    Past Surgical History  Procedure Laterality Date  . Knee surgery  1999 and 2008    arthroscopy to right knee (tennis injuries)  . Cesarean section  2009   Social History  Substance Use Topics  . Smoking status: Never Smoker   . Smokeless tobacco: Never Used  . Alcohol Use: 0.0 oz/week    0 Standard drinks or equivalent per week     Comment: occ   Allergies  Allergen Reactions  . Amoxicillin    Family History  Problem Relation Age of Onset  . Hypothyroidism Mother   . Thyroid disease Mother   . Cancer Father     bladder  . Hyperlipidemia Father   . Hypertension Father   . Skin cancer Paternal Grandmother   . Colon cancer  Paternal Grandfather    X-rays were ordered reviewed and interpreted by me today. X-rays the patient's right knee does show bone demineralization.   Past medical history, social, surgical and family history all reviewed in electronic medical record.   Review of Systems: No headache, visual changes, nausea, vomiting, diarrhea, constipation, dizziness, abdominal pain, skin rash, fevers, chills, night sweats, weight loss, swollen lymph nodes, body aches, joint swelling, muscle aches, chest pain, shortness of breath, mood changes.   Objective Blood pressure 114/78, pulse 95, height 5\' 5"  (1.651 m), weight 131 lb (59.421 kg), SpO2 98 %.  General: No apparent distress alert and oriented x3 mood and affect normal, dressed appropriately.  HEENT: Pupils equal, extraocular movements intact  Respiratory: Patient's speak in full sentences and does not appear short of breath  Cardiovascular: No lower extremity edema, non tender, no erythema  Skin: Warm dry intact with no signs of infection or rash on extremities or on axial skeleton.  Abdomen: Soft nontender  Neuro: Cranial nerves II through XII are intact, neurovascularly intact in all extremities with 2+ DTRs and 2+ pulses.  Lymph: No lymphadenopathy of posterior or anterior cervical chain or axillae bilaterally.  Gait normal with good balance and coordination.  MSK:  Non tender with full range of motion and good stability and symmetric strength and tone of shoulders, elbows, wrist, hip,  and ankles bilaterally.  Knee:RIGHT Normal to inspection with no erythema or effusion or obvious bony abnormalities. Tender over the medial joint line worse than previous exam ROM full in flexion and extension and lower leg rotation. Ligaments with solid consistent endpoints including ACL, PCL, LClL, MCL. Continue positive McMurray's painful patellar compression. Patellar glide with mild crepitus. Patellar and quadriceps tendons unremarkable. Hamstring and  quadriceps strength is normal.  Contralateral knee unremarkable Leg length discrepancy foundright leg still shortened and left leg by a eighth of an inch  After informed written and verbal consent, patient was seated on exam table. Right knee was prepped with alcohol swab and utilizing anterolateral approach, patient's right knee space was injected with 16 mg/2.5 mL of Synvisc (sodium hyaluronate) in a prefilled syringe was injected easily into the knee through a 22-gauge needle.. Patient tolerated the procedure well without immediate complications.    Impression and Recommendations:     This case required medical decision making of moderate complexity.

## 2015-09-10 NOTE — Assessment & Plan Note (Signed)
Patient even viscous supplementation. We discussed with patient about icing and home exercises. We discussed which activities doing which ones to potentially avoid. Patient will continue to be active and will wear brace as tolerated. We discussed possible custom brace in the long run. Patient come back in 1 week for second in a series of 3 injections.

## 2015-09-17 ENCOUNTER — Ambulatory Visit: Payer: Commercial Managed Care - PPO | Admitting: Family Medicine

## 2015-10-01 ENCOUNTER — Ambulatory Visit (INDEPENDENT_AMBULATORY_CARE_PROVIDER_SITE_OTHER): Payer: Commercial Managed Care - PPO | Admitting: Family Medicine

## 2015-10-01 ENCOUNTER — Encounter: Payer: Self-pay | Admitting: Family Medicine

## 2015-10-01 VITALS — BP 102/78 | HR 77 | Ht 65.0 in | Wt 133.0 lb

## 2015-10-01 DIAGNOSIS — M1711 Unilateral primary osteoarthritis, right knee: Secondary | ICD-10-CM | POA: Diagnosis not present

## 2015-10-01 NOTE — Progress Notes (Signed)
[name] [address] [place], [place] [address] Phone: [phone] Subjective:    I'm seeing this patient by the request  of:  FRY,STEPHEN A, MD   CC: Right knee pain follow-up  [id number] Leslie Hughes is a 40 y.o. female coming in with right knee pain. Patient was seen one month ago and was having more knee pain. It seemed the patient had more postsurgical medial meniscal pain. Patient was given an injection. Patient was to do home exercises, icing, patient states unfortunately she feels like she Was worsening. She was sent for an MRI. 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 Patient is failed all conservative therapy including formal physical therapy. Patient failed the steroid injection. Patient center with viscous supplementation. Still has had one injection. Has felt a has been significant improvement. States that she is having no pain with daily activities. Has not started playing tennis she had.      Past Medical History  Diagnosis Date  . Migraine   . Cervical dystonia     sees Dr. Tonye Royalty at [facility]   . Metrorrhagia     sees Dr. Marylynn Pearson   . Allergy   . Vitamin D deficiency   . Degenerative arthritis of right knee 09/10/2015    Started viscous supplementation impression 22nd 2017   Past Surgical History  Procedure Laterality Date  . Knee surgery  1999 and 2008    arthroscopy to right knee (tennis injuries)  . Cesarean section  2009   Social History  Substance Use Topics  . Smoking status: Never Smoker   . Smokeless tobacco: Never Used  . Alcohol Use: 0.0 oz/week    0 Standard drinks or equivalent per week     Comment: occ   Allergies  Allergen Reactions  . Amoxicillin    Family History  Problem Relation Age of Onset  . Hypothyroidism Mother   . Thyroid  disease Mother   . Cancer Father     bladder  . Hyperlipidemia Father   . Hypertension Father   . Skin cancer Paternal Grandmother   . Colon cancer Paternal Grandfather    X-rays were ordered reviewed and interpreted by me today. X-rays the patient's right knee does show bone demineralization.   Past medical history, social, surgical and family history all reviewed in electronic medical record.   Review of Systems: No headache, visual changes, nausea, vomiting, diarrhea, constipation, dizziness, abdominal pain, skin rash, fevers, chills, night sweats, weight loss, swollen lymph nodes, body aches, joint swelling, muscle aches, chest pain, shortness of breath, mood changes.   Objective Blood pressure 102/78, pulse 77, height 5\' 5"  (1.651 m), weight 133 lb (60.328 kg), SpO2 97 %.  General: No apparent distress alert and oriented x3 mood and affect normal, dressed appropriately.  HEENT: Pupils equal, extraocular movements intact  Respiratory: Patient's speak in full sentences and does not appear short of breath  Cardiovascular: No lower extremity edema, non tender, no erythema  Skin: Warm dry intact with no signs of infection or rash on extremities or on axial skeleton.  Abdomen: Soft nontender  Neuro: Cranial nerves II through XII are intact, neurovascularly intact in all extremities with 2+ DTRs and 2+ pulses.  Lymph: No lymphadenopathy of posterior or anterior cervical chain or axillae bilaterally.  Gait normal with good balance and coordination.  MSK:  Non tender with full range of motion and good stability and symmetric strength and tone of shoulders, elbows, wrist, hip,  and ankles bilaterally.  Knee:RIGHT Normal to inspection with no erythema or effusion or obvious bony abnormalities. Minimal tenderness over the medial joint line ROM full in flexion and extension and lower leg rotation. Ligaments with solid consistent endpoints including ACL, PCL, LClL, MCL. Negative  McMurray's painful patellar compression. Patellar glide with mild crepitus. Patellar and quadriceps tendons unremarkable. Hamstring and quadriceps strength is normal.  Contralateral knee unremarkable Leg length discrepancy foundright leg still shortened and left leg by a eighth of an inch still present  After informed written and verbal consent, patient was seated on exam table. Right knee was prepped with alcohol swab and utilizing anterolateral approach, patient's right knee space was injected with 16 mg/2.5 mL of Synvisc (sodium hyaluronate) in a prefilled syringe was injected easily into the knee through a 22-gauge needle.. Patient tolerated the procedure well without immediate complications.    Impression and Recommendations:     This case required medical decision making of moderate complexity.

## 2015-10-01 NOTE — Assessment & Plan Note (Signed)
Second in a series of 3 injections given today. Tolerated the procedures well. Patient will follow-up again in 1 week for third and final injection.

## 2015-10-01 NOTE — Progress Notes (Signed)
Pre visit review using our clinic review tool, if applicable. No additional management support is needed unless otherwise documented below in the visit note. 

## 2015-10-01 NOTE — Patient Instructions (Signed)
Good to see you  I hope this one will make a little more improvement.  Ice in 6 hours.  Keep doing everything else and try tennis in 48 hours See me again next week

## 2015-10-07 ENCOUNTER — Encounter: Payer: Self-pay | Admitting: Family Medicine

## 2015-10-07 ENCOUNTER — Ambulatory Visit (INDEPENDENT_AMBULATORY_CARE_PROVIDER_SITE_OTHER): Payer: Commercial Managed Care - PPO | Admitting: Family Medicine

## 2015-10-07 VITALS — BP 112/76 | HR 82

## 2015-10-07 DIAGNOSIS — M1711 Unilateral primary osteoarthritis, right knee: Secondary | ICD-10-CM | POA: Diagnosis not present

## 2015-10-07 NOTE — Patient Instructions (Signed)
Good to see you  Ice is your friend  Stay active You are done with me! See me when you need me.

## 2015-10-07 NOTE — Assessment & Plan Note (Signed)
During final injection given today. Follow-up as needed. Continue conservative therapy

## 2015-10-07 NOTE — Progress Notes (Signed)
[facility] [address] [place], [place] [address] Phone: [phone] Subjective:    I'm seeing this patient by the request  of:  FRY,STEPHEN A, MD   CC: Right knee pain follow-up  [id number] Leslie Hughes is a 40 y.o. female coming in with right knee pain. Patient was seen one month ago and was having more knee pain. It seemed the patient had more postsurgical medial meniscal pain. Patient was given an injection. Patient was to do home exercises, icing, patient states unfortunately she feels like she Was worsening. She was sent for an MRI. 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 Patient is failed all conservative therapy including formal physical therapy. Patient failed the steroid injection. Patient you for third and final viscous supplementation injection. He is feeling better. Had some stiffness after the last injection. Oral though very happy with the results      Past Medical History  Diagnosis Date  . Migraine   . Cervical dystonia     sees Dr. Tonye Royalty at [facility]   . Metrorrhagia     sees Dr. Marylynn Pearson   . Allergy   . Vitamin D deficiency   . Degenerative arthritis of right knee 09/10/2015    Started viscous supplementation impression 22nd 2017   Past Surgical History  Procedure Laterality Date  . Knee surgery  1999 and 2008    arthroscopy to right knee (tennis injuries)  . Cesarean section  2009   Social History  Substance Use Topics  . Smoking status: Never Smoker   . Smokeless tobacco: Never Used  . Alcohol Use: 0.0 oz/week    0 Standard drinks or equivalent per week     Comment: occ   Allergies  Allergen Reactions  . Amoxicillin    Family History  Problem Relation Age of Onset  . Hypothyroidism Mother   . Thyroid disease Mother   . Cancer Father    bladder  . Hyperlipidemia Father   . Hypertension Father   . Skin cancer Paternal Grandmother   . Colon cancer Paternal Grandfather    X-rays were ordered reviewed and interpreted by me today. X-rays the patient's right knee does show bone demineralization.   Past medical history, social, surgical and family history all reviewed in electronic medical record.   Review of Systems: No headache, visual changes, nausea, vomiting, diarrhea, constipation, dizziness, abdominal pain, skin rash, fevers, chills, night sweats, weight loss, swollen lymph nodes, body aches, joint swelling, muscle aches, chest pain, shortness of breath, mood changes.   Objective Blood pressure 112/76, pulse 82.  General: No apparent distress alert and oriented x3 mood and affect normal, dressed appropriately.  HEENT: Pupils equal, extraocular movements intact  Respiratory: Patient's speak in full sentences and does not appear short of breath  Cardiovascular: No lower extremity edema, non tender, no erythema  Skin: Warm dry intact with no signs of infection or rash on extremities or on axial skeleton.  Abdomen: Soft nontender  Neuro: Cranial nerves II through XII are intact, neurovascularly intact in all extremities with 2+ DTRs and 2+ pulses.  Lymph: No lymphadenopathy of posterior or anterior cervical chain or axillae bilaterally.  Gait normal with good balance and coordination.  MSK:  Non tender with full range of motion and good stability and symmetric strength and tone of shoulders, elbows, wrist, hip,  and  ankles bilaterally.  Knee:RIGHT Normal to inspection with no erythema or effusion or obvious bony abnormalities. nontender on exam ROM full in flexion and extension and lower leg rotation. Ligaments with solid consistent endpoints including ACL, PCL, LClL, MCL. Negative McMurray's painful patellar compression. Patellar glide with mild crepitus. Patellar and quadriceps tendons unremarkable. Hamstring and  quadriceps strength is normal.  Contralateral knee unremarkable Leg length discrepancy foundright leg still shortened and left leg by a eighth of an inch s  After informed written and verbal consent, patient was seated on exam table. Right knee was prepped with alcohol swab and utilizing anterolateral approach, patient's right knee space was injected with 16 mg/2.5 mL of Synvisc (sodium hyaluronate) in a prefilled syringe was injected easily into the knee through a 22-gauge needle.. Patient tolerated the procedure well without immediate complications.    Impression and Recommendations:     This case required medical decision making of moderate complexity.

## 2015-10-29 ENCOUNTER — Ambulatory Visit (INDEPENDENT_AMBULATORY_CARE_PROVIDER_SITE_OTHER): Payer: Commercial Managed Care - PPO | Admitting: Family Medicine

## 2015-10-29 ENCOUNTER — Encounter: Payer: Self-pay | Admitting: Family Medicine

## 2015-10-29 VITALS — BP 106/82 | HR 87 | Temp 98.5°F | Ht 65.0 in | Wt 135.0 lb

## 2015-10-29 DIAGNOSIS — J019 Acute sinusitis, unspecified: Secondary | ICD-10-CM | POA: Diagnosis not present

## 2015-10-29 MED ORDER — LEVOFLOXACIN 500 MG PO TABS: 500.0000 mg | ORAL_TABLET | Freq: Every day | ORAL | 0 refills | Status: AC

## 2015-10-29 NOTE — Progress Notes (Signed)
Pre visit review using our clinic review tool, if applicable. No additional management support is needed unless otherwise documented below in the visit note. 

## 2015-10-29 NOTE — Progress Notes (Signed)
   Subjective:    Patient ID: Leslie Hughes, female    DOB: Apr 24, 1975, 40 y.o.   MRN: [id number]  HPI Here for 3 weeks of sinus pressure, PND, blowing green mucus from the nose and coughing up green sputum. No fever. She saw Urgent Care on 10-17-15 and was given a nebulizer treatment since she was wheezing that day. She took a Zpack and improved somewhat. However she has worsened again. Taking Mucinex D and Benzonatate.    Review of Systems  Constitutional: Negative.   HENT: Positive for congestion, postnasal drip and sinus pressure. Negative for ear pain and sore throat.   Eyes: Negative.   Respiratory: Positive for cough and chest tightness. Negative for shortness of breath and wheezing.   Cardiovascular: Negative.        Objective:   Physical Exam  Constitutional: She appears well-developed and well-nourished.  HENT:  Right Ear: External ear normal.  Left Ear: External ear normal.  Nose: Nose normal.  Mouth/Throat: Oropharynx is clear and moist.  Eyes: Conjunctivae are normal.  Neck: No thyromegaly present.  Pulmonary/Chest: Breath sounds normal. No respiratory distress. She has no wheezes. She has no rales.  Lymphadenopathy:    She has no cervical adenopathy.          Assessment & Plan:  Partially treated sinusitis, given 10 days of Levaquin. Leslie Morale, MD

## 2015-12-30 NOTE — Progress Notes (Signed)
[facility] [place], [place] [address] Phone: [phone] Subjective:    I'm seeing this patient by the request  of:  FRY,STEPHEN A, MD   CC: Right knee pain follow-up  [id number]  Leslie Hughes is a 40 y.o. female coming in with right knee pain. 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 Patient was given Synvisc injections in this finished 3 months ago. Patient states She was doing very well and then unfortunately hurt herself she was playing tennis. Trying to decelerate before she hit the net and unfortunately had pain immediately. Patient states that it significantly anterior aspect the knee. Different than her previous exam. States though somewhat instability is noted. Patient states that it did swell significantly and continues to have some mild swelling. Patient states that it does hurt with every step. Has had one incident where the knee did give out on her.      Past Medical History:  Diagnosis Date  . Allergy   . Cervical dystonia    sees Dr. Tonye Royalty at [facility]   . Degenerative arthritis of right knee 09/10/2015   Started viscous supplementation impression 22nd 2017  . Metrorrhagia    sees Dr. Marylynn Pearson   . Migraine   . Vitamin D deficiency    Past Surgical History:  Procedure Laterality Date  . CESAREAN SECTION  2009  . KNEE SURGERY  1999 and 2008   arthroscopy to right knee (tennis injuries)   Social History  Substance Use Topics  . Smoking status: Never Smoker  . Smokeless tobacco: Never Used  . Alcohol use 0.0 oz/week     Comment: occ   Allergies  Allergen Reactions  . Amoxicillin    Family History  Problem Relation Age of Onset  . Hypothyroidism Mother   . Thyroid disease Mother   . Cancer Father     bladder  . Hyperlipidemia Father   . Hypertension Father   . Skin cancer Paternal Grandmother   . Colon cancer Paternal Grandfather    X-rays were ordered reviewed and interpreted by me  today. X-rays the patient's right knee does show bone demineralization.   Past medical history, social, surgical and family history all reviewed in electronic medical record.   Review of Systems: No headache, visual changes, nausea, vomiting, diarrhea, constipation, dizziness, abdominal pain, skin rash, fevers, chills, night sweats, weight loss, swollen lymph nodes, body aches, joint swelling, muscle aches, chest pain, shortness of breath, mood changes.   Objective  There were no vitals taken for this visit.  General: No apparent distress alert and oriented x3 mood and affect normal, dressed appropriately.  HEENT: Pupils equal, extraocular movements intact  Respiratory: Patient's speak in full sentences and does not appear short of breath  Cardiovascular: No lower extremity edema, non tender, no erythema  Skin: Warm dry intact with no signs of infection or rash on extremities or on axial skeleton.  Abdomen: Soft nontender  Neuro: Cranial nerves II through XII are intact, neurovascularly intact in all extremities with 2+ DTRs and 2+ pulses.  Lymph: No lymphadenopathy of posterior or anterior cervical chain or axillae bilaterally.  Gait normal with good balance and coordination.  MSK:  Non tender with full range of motion and good stability and symmetric strength and tone of shoulders, elbows, wrist, hip,  and ankles bilaterally.  Knee:RIGHT Trace swelling on Tender over the lateral joint line as well as the lateral quadricep muscle ROM full in flexion and extension and  lower leg rotation. Tightness with the last 5 of flexion Mild laxity of the LCL but endpoint noted Negative McMurray's painful patellar compression. Patellar glide with mild crepitus. Patellar and quadriceps tendons unremarkable. Hamstring and quadriceps strength is normal.  Contralateral knee unremarkable  MSK US performed of: Right knee This study was ordered, performed, and interpreted by Charlann Boxer D.O.  Knee: All  structures visualized. Anteromedial, anterolateral, posteromedial, and posterolateral menisci unremarkable without tearing, fraying, effusion, or displacement. No abnormality of prepatellar bursa. LCL does have an articular side tear but appears to be intact. Tear is approximately 20% Vastus lateralis muscle does have a intersubstance tear noted with increasing Doppler flow  IMPRESSION:  Septated or with LCL injury     Impression and Recommendations:     This case required medical decision making of moderate complexity.

## 2015-12-31 ENCOUNTER — Other Ambulatory Visit: Payer: Self-pay

## 2015-12-31 ENCOUNTER — Ambulatory Visit (INDEPENDENT_AMBULATORY_CARE_PROVIDER_SITE_OTHER): Payer: Commercial Managed Care - PPO | Admitting: Family Medicine

## 2015-12-31 ENCOUNTER — Encounter: Payer: Self-pay | Admitting: Family Medicine

## 2015-12-31 VITALS — BP 118/82 | HR 83 | Wt 140.0 lb

## 2015-12-31 DIAGNOSIS — S83421A Sprain of lateral collateral ligament of right knee, initial encounter: Secondary | ICD-10-CM

## 2015-12-31 DIAGNOSIS — S76119A Strain of unspecified quadriceps muscle, fascia and tendon, initial encounter: Secondary | ICD-10-CM | POA: Insufficient documentation

## 2015-12-31 DIAGNOSIS — S76111A Strain of right quadriceps muscle, fascia and tendon, initial encounter: Secondary | ICD-10-CM

## 2015-12-31 DIAGNOSIS — M25561 Pain in right knee: Secondary | ICD-10-CM | POA: Diagnosis not present

## 2015-12-31 MED ORDER — MELOXICAM 15 MG PO TABS: 15.0000 mg | ORAL_TABLET | Freq: Every day | ORAL | 0 refills | Status: DC

## 2015-12-31 NOTE — Patient Instructions (Signed)
Good to se eyou  Sorry for the news Partial tear of LCL. Avoid a lot of lateral movement.  Wear brace daily for 1 week then with exercises likely the next 4 weeks.  Ice 20 minutes 2 times daily. Usually after activity and before bed. Meloxicam daily for 10 days then as needed.  See me again in 3 weeks to make sure you are healing.

## 2015-12-31 NOTE — Assessment & Plan Note (Signed)
Partial tear noted. Bracing was given, discussed oral anti-inflammatories, discussed what activities to avoid. We discussed the quadricep injury. Patient will do compression sleeve. Follow-up again in 3 weeks. At that time we'll start her on home exercises.

## 2016-01-05 ENCOUNTER — Other Ambulatory Visit: Payer: Self-pay | Admitting: Family Medicine

## 2016-01-06 NOTE — Telephone Encounter (Signed)
Refill done.  

## 2016-01-07 ENCOUNTER — Other Ambulatory Visit: Payer: Commercial Managed Care - PPO

## 2016-01-07 DIAGNOSIS — M25561 Pain in right knee: Secondary | ICD-10-CM

## 2016-01-20 NOTE — Progress Notes (Signed)
[name] [place],  [address] Phone: [phone] Subjective:    I'm seeing this patient by the request  of:  FRY,STEPHEN A, MD   CC: Right knee pain follow-up  [id number]  Leslie Hughes is a 40 y.o. female coming in with right knee pain. 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 Patient was seen by me and was found to have excellent quadricep injury as well as an LCL tear. Patient was given a brace, home exercises, icing protocol was to avoid certain activities including tennis. Patient states She is making improvement. Feels that she is 60-70% improved. Patient states still some mild soreness of the quadricep. No instability of the knee. Has been wearing the brace when she does do activities.      Past Medical History:  Diagnosis Date  . Allergy   . Cervical dystonia    sees Dr. Tonye Royalty at [facility]   . Degenerative arthritis of right knee 09/10/2015   Started viscous supplementation impression 22nd 2017  . Metrorrhagia    sees Dr. Marylynn Pearson   . Migraine   . Vitamin D deficiency    Past Surgical History:  Procedure Laterality Date  . CESAREAN SECTION  2009  . KNEE SURGERY  1999 and 2008   arthroscopy to right knee (tennis injuries)   Social History  Substance Use Topics  . Smoking status: Never Smoker  . Smokeless tobacco: Never Used  . Alcohol use 0.0 oz/week     Comment: occ   Allergies  Allergen Reactions  . Amoxicillin    Family History  Problem Relation Age of Onset  . Hypothyroidism Mother   . Thyroid disease Mother   . Cancer Father     bladder  . Hyperlipidemia Father   . Hypertension Father   . Skin cancer Paternal Grandmother   . Colon cancer Paternal Grandfather    X-rays were ordered reviewed and interpreted by me today. X-rays the patient's right knee does show bone demineralization.   Past medical history, social, surgical and family history all reviewed in electronic  medical record.   Review of Systems: No headache, visual changes, nausea, vomiting, diarrhea, constipation, dizziness, abdominal pain, skin rash, fevers, chills, night sweats, weight loss, swollen lymph nodes, body aches, joint swelling, muscle aches, chest pain, shortness of breath, mood changes.   Objective  Blood pressure 114/80, pulse 79, height 5\' 5"  (1.651 m), weight 137 lb (62.1 kg), SpO2 97 %.  Systems examined below as of 01/21/16 General: NAD A&O x3 mood, affect normal  HEENT: Pupils equal, extraocular movements intact no nystagmus Respiratory: not short of breath at rest or with speaking Cardiovascular: No lower extremity edema, non tender Skin: Warm dry intact with no signs of infection or rash on extremities or on axial skeleton. Abdomen: Soft nontender, no masses Neuro: Cranial nerves  intact, neurovascularly intact in all extremities with 2+ DTRs and 2+ pulses. Lymph: No lymphadenopathy appreciated today  Gait normal with good balance and coordination.  MSK: Non tender with full range of motion and good stability and symmetric strength and tone of shoulders, elbows, wrist, hips and ankles bilaterally.   Knee:RIGHT No swelling noted Mild pain over the quad distally LCL intact  Negative McMurray's painful patellar compression. Patellar glide with mild crepitus. Patellar and quadriceps tendons unremarkable. Hamstring and quadriceps strength is normal.  Contralateral knee unremarkable  MSK US performed of: Right knee This study was ordered, performed, and interpreted by Charlann Boxer D.O.  Knee: All structures visualized. Anteromedial, anterolateral, posteromedial, and posterolateral menisci unremarkable without tearing, fraying, effusion, or displacement. No abnormality of prepatellar bursa. LCL tear  Seems to be stable healing noted. Vastus lateralis muscle has good scar tissue formation and increased neovascularization. IMPRESSION:  Interval healing of quadricep  muscle tear with scar tissue formation as well as LCL improving.     Impression and Recommendations:     This case required medical decision making of moderate complexity.

## 2016-01-21 ENCOUNTER — Ambulatory Visit (INDEPENDENT_AMBULATORY_CARE_PROVIDER_SITE_OTHER): Payer: Commercial Managed Care - PPO | Admitting: Family Medicine

## 2016-01-21 ENCOUNTER — Encounter: Payer: Self-pay | Admitting: Family Medicine

## 2016-01-21 ENCOUNTER — Ambulatory Visit: Payer: Self-pay

## 2016-01-21 VITALS — BP 114/80 | HR 79 | Ht 65.0 in | Wt 137.0 lb

## 2016-01-21 DIAGNOSIS — S83421A Sprain of lateral collateral ligament of right knee, initial encounter: Secondary | ICD-10-CM

## 2016-01-21 DIAGNOSIS — M1711 Unilateral primary osteoarthritis, right knee: Secondary | ICD-10-CM | POA: Diagnosis not present

## 2016-01-21 DIAGNOSIS — S76111D Strain of right quadriceps muscle, fascia and tendon, subsequent encounter: Secondary | ICD-10-CM | POA: Diagnosis not present

## 2016-01-21 NOTE — Assessment & Plan Note (Signed)
Stable at this time. We'll continue to monitor.

## 2016-01-21 NOTE — Patient Instructions (Addendum)
Good to see you  I am glad we are making progress.  LEts consider a thigh compression sleeve with a lot of activity  Give me 2 more weeks until tennis OK to increase gym workout but no significant dynamic movement.  See me again in 4 weeks just to make sure all the way there.

## 2016-01-21 NOTE — Assessment & Plan Note (Signed)
Healing at this time. No significant change in management. Work with Product/process development scientist to learn home exercises in greater detail. Follow-up again in 4 weeks to make sure completely healed.

## 2016-01-21 NOTE — Assessment & Plan Note (Signed)
Stable and improving. No gapping noted interval healing noted on ultrasound. Continue with conservative therapy. Patient will stay away from tennis or any type of twisting motions for another 2 weeks.

## 2016-02-08 ENCOUNTER — Ambulatory Visit (INDEPENDENT_AMBULATORY_CARE_PROVIDER_SITE_OTHER): Payer: Commercial Managed Care - PPO | Admitting: Family Medicine

## 2016-02-08 ENCOUNTER — Encounter: Payer: Self-pay | Admitting: Family Medicine

## 2016-02-08 VITALS — BP 110/78 | HR 84 | Temp 98.8°F | Wt 139.4 lb

## 2016-02-08 DIAGNOSIS — J0100 Acute maxillary sinusitis, unspecified: Secondary | ICD-10-CM

## 2016-02-08 MED ORDER — DOXYCYCLINE HYCLATE 100 MG PO TABS: 100.0000 mg | ORAL_TABLET | Freq: Two times a day (BID) | ORAL | 0 refills | Status: DC

## 2016-02-08 MED ORDER — PREDNISONE 10 MG PO TABS: ORAL_TABLET | ORAL | 0 refills | Status: DC

## 2016-02-08 NOTE — Progress Notes (Signed)
Subjective:    Patient ID: Leslie Hughes, female    DOB: November 17, 1975, 40 y.o.   MRN: [id number]  HPI  Leslie Hughes is a 40 year old female who presents today with sinus pressure/pain that has been present for 10 days.  Associated symptoms of nasal congestion, hyponasal speech, rhinitis with yellow/green mucus, tooth pain, post nasal drip, and nonproductive cough.  Low grade fever reported by patient with chills. She denies N/V/D.  She denies recent antibiotic use. Recent sick contact exposure with child. No influenza vaccine at this time. She is concerned about her symptoms as she will be spending Thanksgiving with her mother who has end stage COPD. Sinus pressure is the most concerning for this patient. No treatments have been tried at home. No aggravating or alleviating factors noted.  Review of Systems  Constitutional: Positive for chills. Negative for fatigue and fever.  HENT: Positive for congestion, ear pain, postnasal drip, rhinorrhea, sinus pain and sinus pressure.   Eyes: Negative for visual disturbance.  Respiratory: Positive for cough. Negative for shortness of breath and wheezing.   Cardiovascular: Negative for chest pain and palpitations.  Gastrointestinal: Negative for abdominal pain, diarrhea, nausea and vomiting.  Musculoskeletal: Negative for myalgias.  Skin: Negative for rash.  Neurological: Negative for dizziness.   Past Medical History:  Diagnosis Date  . Allergy   . Cervical dystonia    sees Dr. Tonye Royalty at [facility]   . Degenerative arthritis of right knee 09/10/2015   Started viscous supplementation impression 22nd 2017  . Metrorrhagia    sees Dr. Marylynn Pearson   . Migraine   . Vitamin D deficiency      Social History   Social History  . Marital status: Married    Spouse name: N/A  . Number of children: N/A  . Years of education: N/A   Occupational History  . Not on file.   Social History Main Topics  . Smoking status: Never Smoker  .  Smokeless tobacco: Never Used  . Alcohol use 0.0 oz/week     Comment: occ  . Drug use: No  . Sexual activity: Not on file   Other Topics Concern  . Not on file   Social History Narrative  . No narrative on file    Past Surgical History:  Procedure Laterality Date  . CESAREAN SECTION  2009  . KNEE SURGERY  1999 and 2008   arthroscopy to right knee (tennis injuries)    Family History  Problem Relation Age of Onset  . Hypothyroidism Mother   . Thyroid disease Mother   . Cancer Father     bladder  . Hyperlipidemia Father   . Hypertension Father   . Skin cancer Paternal Grandmother   . Colon cancer Paternal Grandfather     Allergies  Allergen Reactions  . Amoxicillin     Current Outpatient Prescriptions on File Prior to Visit  Medication Sig Dispense Refill  . diclofenac (VOLTAREN) 75 MG EC tablet Take 1 tablet (75 mg total) by mouth 2 (two) times daily. 60 tablet 2  . meloxicam (MOBIC) 15 MG tablet Take 1 tablet (15 mg total) by mouth daily. 30 tablet 0  . Multiple Vitamin (MULTIVITAMIN) tablet Take 1 tablet by mouth daily.      . NONFORMULARY OR COMPOUNDED ITEM Progesterone cream 75 mg/ml, to apply 0.5 ml to each inner thigh daily on days 1-25 of the monthly cycle 30 each 11  . Norethin Ace-Eth Estrad-FE (LOESTRIN FE 1/20 PO) Take by  mouth daily. Lo lo estrin    . temazepam (RESTORIL) 30 MG capsule TAKE ONE CAPSULE BY MOUTH AT BEDTIME 30 capsule 5  . tiZANidine (ZANAFLEX) 2 MG tablet TAKE 1 TABLET BY MOUTH EVERY MORNING AND TAKE 2 TABLETS EVERY EVENING 90 tablet 11  . Vitamin D, Ergocalciferol, (DRISDOL) 50000 units CAPS capsule TAKE 1 CAPSULE BY MOUTH EVERY 7 DAYS 12 capsule 0  . Diclofenac Sodium 2 % SOLN Apply 1 pump twice daily. (Patient not taking: Reported on 02/08/2016) 112 g 3   No current facility-administered medications on file prior to visit.     BP 110/78 (BP Location: Right Arm, Patient Position: Sitting, Cuff Size: Normal)   Pulse 84   Temp 98.8 F  (37.1 C) (Oral)   Wt 139 lb 6.4 oz (63.2 kg)   SpO2 98%   BMI 23.20 kg/m         Objective:   Physical Exam  Constitutional: She is oriented to person, place, and time. She appears well-developed and well-nourished.  HENT:  Nose: Rhinorrhea present. Right sinus exhibits maxillary sinus tenderness. Right sinus exhibits no frontal sinus tenderness. Left sinus exhibits maxillary sinus tenderness. Left sinus exhibits no frontal sinus tenderness.  Mouth/Throat: Mucous membranes are normal. No oropharyngeal exudate or posterior oropharyngeal erythema.  Dull TMs bilaterally  Eyes: Pupils are equal, round, and reactive to light. No scleral icterus.  Neck: Neck supple.  Cardiovascular: Normal rate and regular rhythm.   Pulmonary/Chest: Effort normal and breath sounds normal. She has no wheezes. She has no rales.  Lymphadenopathy:    She has cervical adenopathy.  Neurological: She is alert and oriented to person, place, and time.  Skin: Skin is warm and dry. No rash noted.        Assessment & Plan:  1. Acute maxillary sinusitis, recurrence not specified Length of symptom duration and acutely ill appearance support treatment for sinusitis. Sinus pressure treated with prednisone. Advised use of mucinex if needed and follow up if symptoms do not improve in 2 to 3 days with treatment, worsen, or she develops a fever >101.  - doxycycline (VIBRA-TABS) 100 MG tablet; Take 1 tablet (100 mg total) by mouth 2 (two) times daily.  Dispense: 20 tablet; Refill: 0 - predniSONE (DELTASONE) 10 MG tablet; Take 4 tablets once daily for 2 days, 3 tabs daily for 2 days, 2 tabs daily for 2 days, 1 tab daily for 2 days.  Dispense: 20 tablet; Refill: 0  Delano Metz, FNP-C

## 2016-02-08 NOTE — Patient Instructions (Signed)
Please take medication as directed and follow up if symptoms do not improve in 2 to 3 days, worsen, or you develop a fever >101.   Sinusitis, Adult Sinusitis is soreness and inflammation of your sinuses. Sinuses are hollow spaces in the bones around your face. They are located:  Around your eyes.  In the middle of your forehead.  Behind your nose.  In your cheekbones. Your sinuses and nasal passages are lined with a stringy fluid (mucus). Mucus normally drains out of your sinuses. When your nasal tissues get inflamed or swollen, the mucus can get trapped or blocked so air cannot flow through your sinuses. This lets bacteria, viruses, and funguses grow, and that leads to infection. Follow these instructions at home: Medicines  Take, use, or apply over-the-counter and prescription medicines only as told by your doctor. These may include nasal sprays.  If you were prescribed an antibiotic medicine, take it as told by your doctor. Do not stop taking the antibiotic even if you start to feel better. Hydrate and Humidify  Drink enough water to keep your pee (urine) clear or pale yellow.  Use a cool mist humidifier to keep the humidity level in your home above 50%.  Breathe in steam for 10-15 minutes, 3-4 times a day or as told by your doctor. You can do this in the bathroom while a hot shower is running.  Try not to spend time in cool or dry air. Rest  Rest as much as possible.  Sleep with your head raised (elevated).  Make sure to get enough sleep each night. General instructions  Put a warm, moist washcloth on your face 3-4 times a day or as told by your doctor. This will help with discomfort.  Wash your hands often with soap and water. If there is no soap and water, use hand sanitizer.  Do not smoke. Avoid being around people who are smoking (secondhand smoke).  Keep all follow-up visits as told by your doctor. This is important. Contact a doctor if:  You have a  fever.  Your symptoms get worse.  Your symptoms do not get better within 10 days. Get help right away if:  You have a very bad headache.  You cannot stop throwing up (vomiting).  You have pain or swelling around your face or eyes.  You have trouble seeing.  You feel confused.  Your neck is stiff.  You have trouble breathing. This information is not intended to replace advice given to you by your health care provider. Make sure you discuss any questions you have with your health care provider. Document Released: 08/24/2007 Document Revised: 11/01/2015 Document Reviewed: 12/31/2014 Elsevier Interactive Patient Education  2017 [employer].

## 2016-02-17 NOTE — Progress Notes (Signed)
[name] [place], [place] [address] Phone: [phone] Subjective:    I'm seeing this patient by the request  of:  FRY,STEPHEN A, MD   CC: Right knee pain follow-up  [id number]  Leslie Hughes is a 40 y.o. female coming in with right knee pain. 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 Patient was seen by me and was found to have excellent quadricep injury as well as an LCL tear. Patient was 60% better at last follow-up. Patient was to continue with conservative therapy. Patient states Still having some mild discomfort in the quadricep itself. Having very minimal discomfort overall though. Patient has not been playing tennis should. Has been doing other working out but does not seem to rotated as much. Still though has some mild discomfort.      Past Medical History:  Diagnosis Date  . Allergy   . Cervical dystonia    sees Dr. Tonye Royalty at [facility]   . Degenerative arthritis of right knee 09/10/2015   Started viscous supplementation impression 22nd 2017  . Metrorrhagia    sees Dr. Marylynn Pearson   . Migraine   . Vitamin D deficiency    Past Surgical History:  Procedure Laterality Date  . CESAREAN SECTION  2009  . KNEE SURGERY  1999 and 2008   arthroscopy to right knee (tennis injuries)   Social History  Substance Use Topics  . Smoking status: Never Smoker  . Smokeless tobacco: Never Used  . Alcohol use 0.0 oz/week     Comment: occ   Allergies  Allergen Reactions  . Amoxicillin    Family History  Problem Relation Age of Onset  . Hypothyroidism Mother   . Thyroid disease Mother   . Cancer Father     bladder  . Hyperlipidemia Father   . Hypertension Father   . Skin cancer Paternal Grandmother   . Colon cancer Paternal Grandfather    X-rays were ordered reviewed and interpreted by me today. X-rays the patient's right knee does show bone demineralization.   Past medical history, social, surgical and family  history all reviewed in electronic medical record.   Review of Systems: No headache, visual changes, nausea, vomiting, diarrhea, constipation, dizziness, abdominal pain, skin rash, fevers, chills, night sweats, weight loss, swollen lymph nodes, body aches, joint swelling, muscle aches, chest pain, shortness of breath, mood changes.   Objective  Blood pressure 122/80, pulse 91, height 5\' 5"  (1.651 m), weight 138 lb (62.6 kg), SpO2 97 %.  Systems examined below as of 02/18/16 General: NAD A&O x3 mood, affect normal  HEENT: Pupils equal, extraocular movements intact no nystagmus Respiratory: not short of breath at rest or with speaking Cardiovascular: No lower extremity edema, non tender Skin: Warm dry intact with no signs of infection or rash on extremities or on axial skeleton. Abdomen: Soft nontender, no masses Neuro: Cranial nerves  intact, neurovascularly intact in all extremities with 2+ DTRs and 2+ pulses. Lymph: No lymphadenopathy appreciated today  Gait normal with good balance and coordination.  MSK: Non tender with full range of motion and good stability and symmetric strength and tone of shoulders, elbows, wrist,  knee hips and ankles bilaterally.   Knee:RIGHT No swelling noted patient does have mild atrophy compared to the contralateral side. Mild pain over the quad distally still remaining. LCL intact  Negative McMurray's painful patellar compression. Patellar glide with mild crepitus. Patellar and quadriceps tendons unremarkable. Hamstring and quadriceps strength is normal.  Contralateral knee  unremarkable  MSK US performed of: Right knee This study was ordered, performed, and interpreted by Charlann Boxer D.O.  Knee:Marland Kitchen Vastus lateralis muscle has good scar tissue formation mild atrophy of the muscle. IMPRESSION:  Interval healing of quadricep muscle tear with scar tissue formation as well as LCL improving.     Impression and Recommendations:     This case required  medical decision making of moderate complexity.

## 2016-02-18 ENCOUNTER — Ambulatory Visit (INDEPENDENT_AMBULATORY_CARE_PROVIDER_SITE_OTHER): Payer: Commercial Managed Care - PPO | Admitting: Family Medicine

## 2016-02-18 ENCOUNTER — Encounter: Payer: Self-pay | Admitting: Family Medicine

## 2016-02-18 DIAGNOSIS — S76111D Strain of right quadriceps muscle, fascia and tendon, subsequent encounter: Secondary | ICD-10-CM | POA: Diagnosis not present

## 2016-02-18 NOTE — Patient Instructions (Signed)
Good to see you  Alvera Singh is your friend Start playing 4-6 weeks.

## 2016-02-18 NOTE — Assessment & Plan Note (Signed)
No improvement. Discussed with patient at great length. Encourage patient to start increasing activities and her to the atrophy of the musculature. Patient will start increasing activity as tolerated. We discussed that there should be some discomfort but not pain. Patient will continue the once weekly vitamin D. We will see how patient response otherwise. Follow-up again in 4-6 weeks if not completely resolved. Discuss at great length all questions answered.  Spent  25 minutes with patient face-to-face and had greater than 50% of counseling including as described above in assessment and plan.

## 2016-03-23 NOTE — Progress Notes (Signed)
  [facility] [place], [place] [address] Phone: [phone] Subjective:    I'm seeing this patient by the request  of:  Leslie Penna, MD   CC: Right knee pain follow-up  [id number]  Leslie Hughes is a 41 y.o. female coming in with right knee pain. 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 Patient was seen by me and was found to have excellent quadricep injury as well as an LCL tear. Patient was 60% better at last follow-up. Patient was improving and was to increase activity as tolerated. Patient states Doing well overall. Patient was started to exercise routine. Was to make sure she is doing well. Has been playing tennis.      Past Medical History:  Diagnosis Date  . Allergy   . Cervical dystonia    sees Dr. Tonye Royalty at [facility]   . Degenerative arthritis of right knee 09/10/2015   Started viscous supplementation impression 22nd 2017  . Metrorrhagia    sees Dr. Marylynn Pearson   . Migraine   . Vitamin D deficiency    Past Surgical History:  Procedure Laterality Date  . CESAREAN SECTION  2009  . KNEE SURGERY  1999 and 2008   arthroscopy to right knee (tennis injuries)   Social History  Substance Use Topics  . Smoking status: Never Smoker  . Smokeless tobacco: Never Used  . Alcohol use 0.0 oz/week     Comment: occ   Allergies  Allergen Reactions  . Amoxicillin    Family History  Problem Relation Age of Onset  . Hypothyroidism Mother   . Thyroid disease Mother   . Cancer Father     bladder  . Hyperlipidemia Father   . Hypertension Father   . Skin cancer Paternal Grandmother   . Colon cancer Paternal Grandfather    X-rays were ordered reviewed and interpreted by me today. X-rays the patient's right knee does show bone demineralization.   Past medical history, social, surgical and family history all reviewed in electronic medical record.   Review of Systems: No headache, visual changes, nausea, vomiting,  diarrhea, constipation, dizziness, abdominal pain, skin rash, fevers, chills, night sweats, weight loss, swollen lymph nodes, body aches, joint swelling, muscle aches, chest pain, shortness of breath, mood changes.    Objective  Blood pressure 128/82, pulse 91, height 5\' 5"  (1.651 m), weight 139 lb (63 kg), SpO2 97 %.  Systems examined below as of 03/24/16 General: NAD A&O x3 mood, affect normal  HEENT: Pupils equal, extraocular movements intact no nystagmus Respiratory: not short of breath at rest or with speaking Cardiovascular: No lower extremity edema, non tender Skin: Warm dry intact with no signs of infection or rash on extremities or on axial skeleton. Abdomen: Soft nontender, no masses Neuro: Cranial nerves  intact, neurovascularly intact in all extremities with 2+ DTRs and 2+ pulses. Lymph: No lymphadenopathy appreciated today  Gait normal with good balance and coordination.  MSK: Non tender with full range of motion and good stability and symmetric strength and tone of shoulders, elbows, wrist, hips and ankles bilaterally.    Knee:RIGHT Improvement in VMO strength Nontender on exam LCL intact seems stable otherwise. Negative McMurray's painful patellar compression. Patellar glide with mild crepitus. Patellar and quadriceps tendons unremarkable. Hamstring and quadriceps strength is normal.  Contralateral knee unremarkable      Impression and Recommendations:     This case required medical decision making of moderate complexity.

## 2016-03-24 ENCOUNTER — Encounter: Payer: Self-pay | Admitting: Family Medicine

## 2016-03-24 ENCOUNTER — Ambulatory Visit (INDEPENDENT_AMBULATORY_CARE_PROVIDER_SITE_OTHER): Payer: Commercial Managed Care - PPO | Admitting: Family Medicine

## 2016-03-24 DIAGNOSIS — S83421A Sprain of lateral collateral ligament of right knee, initial encounter: Secondary | ICD-10-CM | POA: Diagnosis not present

## 2016-03-24 NOTE — Assessment & Plan Note (Signed)
Healed at this time continue with conservative therapy. Patient will be starting work with a Clinical research associate. We'll see how patient does well.

## 2016-03-24 NOTE — Patient Instructions (Signed)
Good to see you  You will do great  Try to focus on the VMO strength still  I would still avoid deep squats (more then 85 degrees) or any movement causing you to balance and twist Remember that recovery is just as important.  See me when you need me.

## 2016-05-02 ENCOUNTER — Telehealth: Payer: Self-pay | Admitting: Family Medicine

## 2016-05-02 MED ORDER — OSELTAMIVIR PHOSPHATE 75 MG PO CAPS: 75.0000 mg | ORAL_CAPSULE | Freq: Two times a day (BID) | ORAL | 0 refills | Status: DC

## 2016-05-02 NOTE — Telephone Encounter (Signed)
I sent script e-scribe to [facility] and spoke with pt.  

## 2016-05-02 NOTE — Telephone Encounter (Signed)
Call in Tamiflu 75 mg bid for 5 days  

## 2016-05-02 NOTE — Telephone Encounter (Signed)
Pt son just got dx with flu and mom would like tamiflu sent to [facility] main Animal nutritionist

## 2016-05-04 ENCOUNTER — Ambulatory Visit: Payer: Commercial Managed Care - PPO | Admitting: Family Medicine

## 2016-05-06 ENCOUNTER — Ambulatory Visit: Payer: Commercial Managed Care - PPO | Admitting: Family Medicine

## 2016-05-18 ENCOUNTER — Ambulatory Visit (INDEPENDENT_AMBULATORY_CARE_PROVIDER_SITE_OTHER): Payer: Commercial Managed Care - PPO | Admitting: Family Medicine

## 2016-05-18 ENCOUNTER — Encounter: Payer: Self-pay | Admitting: Family Medicine

## 2016-05-18 VITALS — BP 109/78 | HR 69 | Temp 98.5°F | Ht 65.0 in | Wt 134.0 lb

## 2016-05-18 DIAGNOSIS — L03032 Cellulitis of left toe: Secondary | ICD-10-CM

## 2016-05-18 MED ORDER — CEPHALEXIN 500 MG PO CAPS: 500.0000 mg | ORAL_CAPSULE | Freq: Four times a day (QID) | ORAL | 0 refills | Status: AC

## 2016-05-18 NOTE — Patient Instructions (Signed)
**  Coming March 12th**  [facility]!!!  Same Day Appointments for Acute Care: Sprains, Injuries, cuts, abrasions Colds, flu, sore throats, cough, upset stomachs Fever, ear pain Sinus and eye infections Animal/insect bites  3 Easy Ways to Schedule: Walk-In Scheduling Call in scheduling Mychart Sign-up: [email or web address]         

## 2016-05-18 NOTE — Progress Notes (Signed)
Pre visit review using our clinic review tool, if applicable. No additional management support is needed unless otherwise documented below in the visit note. 

## 2016-05-18 NOTE — Progress Notes (Signed)
   Subjective:    Patient ID: Leslie Hughes, female    DOB: 1975-08-19, 41 y.o.   MRN: [id number]  HPI Here for 5 days of swelling and pain in the left 2nd toe. This started shortly after she had a pedicure. She has been soaking in warm water with Epsom salts. She has taken 4 days of Bactrim that her husband gave her but this has not helped.    Review of Systems  Constitutional: Negative.   Skin: Positive for color change.       Objective:   Physical Exam  Constitutional: She appears well-developed and well-nourished.  Skin:  The left 2nd toe has an area of redness, swelling and tenderness at the nail edge           Assessment & Plan:  Paronychia. She will stop the Bactrim and take Keflex for 10 days. Recheck prn. [name], MD

## 2016-06-01 ENCOUNTER — Other Ambulatory Visit: Payer: Self-pay | Admitting: Family Medicine

## 2016-06-02 DIAGNOSIS — L03031 Cellulitis of right toe: Secondary | ICD-10-CM | POA: Diagnosis not present

## 2016-06-09 DIAGNOSIS — L03031 Cellulitis of right toe: Secondary | ICD-10-CM | POA: Diagnosis not present

## 2016-06-13 DIAGNOSIS — M531 Cervicobrachial syndrome: Secondary | ICD-10-CM | POA: Diagnosis not present

## 2016-06-13 DIAGNOSIS — M9901 Segmental and somatic dysfunction of cervical region: Secondary | ICD-10-CM | POA: Diagnosis not present

## 2016-06-13 DIAGNOSIS — M9902 Segmental and somatic dysfunction of thoracic region: Secondary | ICD-10-CM | POA: Diagnosis not present

## 2016-06-23 NOTE — Progress Notes (Signed)
[name] [place], [place] [address] Phone: [phone] Subjective:    I'm seeing this patient by the request  of:  [name], MD   CC: Right knee pain follow-up  [id number]  Leslie Hughes is a 41 y.o. female coming in with right knee pain. 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 Patient also had an LCL tear and then seemed to respond to conservative therapy. Was doing quite better. Patient was seen around and extend her knee and an audible pop. Swelling for approximately 2 days. Feels that she is unable to extend the knee all the way. Patient denies any radiation of pain, any numbness, any weakness. Patient though states that unfortunately unable to do any working out or any tennis for the last 10 days.      Past Medical History:  Diagnosis Date  . Allergy   . Cervical dystonia    sees Dr. Tonye Royalty at [facility]   . Degenerative arthritis of right knee 09/10/2015   Started viscous supplementation impression 22nd 2017  . Metrorrhagia    sees Dr. Marylynn Pearson   . Migraine   . Vitamin D deficiency    Past Surgical History:  Procedure Laterality Date  . CESAREAN SECTION  2009  . KNEE SURGERY  1999 and 2008   arthroscopy to right knee (tennis injuries)   Social History  Substance Use Topics  . Smoking status: Never Smoker  . Smokeless tobacco: Never Used  . Alcohol use 0.0 oz/week     Comment: occ   Allergies  Allergen Reactions  . Amoxicillin    Family History  Problem Relation Age of Onset  . Hypothyroidism Mother   . Thyroid disease Mother   . Cancer Father     bladder  . Hyperlipidemia Father   . Hypertension Father   . Skin cancer Paternal Grandmother   . Colon cancer Paternal Grandfather    X-rays were ordered reviewed and interpreted by me today. X-rays the patient's right knee does show bone demineralization.   Past medical history, social, surgical and family history all reviewed in  electronic medical record.   Review of Systems: No headache, visual changes, nausea, vomiting, diarrhea, constipation, dizziness, abdominal pain, skin rash, fevers, chills, night sweats, weight loss, swollen lymph nodes, body aches, joint swelling, muscle aches, chest pain, shortness of breath, mood changes.    Objective  Blood pressure 118/80, pulse 72, height 5' (1.524 m), weight 137 lb (62.1 kg).  Systems examined below as of 06/24/16 General: NAD A&O x3 mood, affect normal  HEENT: Pupils equal, extraocular movements intact no nystagmus Respiratory: not short of breath at rest or with speaking Cardiovascular: No lower extremity edema, non tender Skin: Warm dry intact with no signs of infection or rash on extremities or on axial skeleton. Abdomen: Soft nontender, no masses Neuro: Cranial nerves  intact, neurovascularly intact in all extremities with 2+ DTRs and 2+ pulses. Lymph: No lymphadenopathy appreciated today  Gait normal with good balance and coordination.  MSK: Non tender with full range of motion and good stability and symmetric strength and tone of shoulders, elbows, wrist,  hips and ankles bilaterally.   Knee: Right Mild lateral translation of the knee Noted Tenderness noted with patient having the patella somewhat subluxed Unable to extend the knee and the last 5 until an audible pop of the kneecap. Ligaments with solid consistent endpoints including ACL, PCL, LCL, MCL. Negative Mcmurray's, Apley's, and Thessalonian tests. Mild painful patellar compression  Patellar glide with mild crepitus. Patellar and quadriceps tendons unremarkable. Hamstring and quadriceps strength is normal. Lateral knee unremarkable  MSK US performed of: Right knee This study was ordered, performed, and interpreted by Charlann Boxer D.O.  Knee: All structures visualized. Patient's previous chondral malacia still noted but unfortunately some over-the-counter malacia seems to be worsening. Possible  cartilage injury noted. Hypoechoic changes and increasing Doppler flow of the superior medial aspect of the patella.. Patellar Tendon unremarkable on long and transverse views without effusion. No abnormality of prepatellar bursa. LCL and MCL unremarkable on long and transverse views. No abnormality of origin of medial or lateral head of the gastrocnemius.  IMPRESSION:  Patellar subluxation      Impression and Recommendations:     This case required medical decision making of moderate complexity.

## 2016-06-24 ENCOUNTER — Ambulatory Visit (INDEPENDENT_AMBULATORY_CARE_PROVIDER_SITE_OTHER): Payer: Commercial Managed Care - PPO | Admitting: Family Medicine

## 2016-06-24 ENCOUNTER — Ambulatory Visit: Payer: Self-pay

## 2016-06-24 ENCOUNTER — Encounter: Payer: Self-pay | Admitting: Family Medicine

## 2016-06-24 VITALS — BP 118/80 | HR 72 | Ht 60.0 in | Wt 137.0 lb

## 2016-06-24 DIAGNOSIS — M25561 Pain in right knee: Secondary | ICD-10-CM

## 2016-06-24 DIAGNOSIS — M1711 Unilateral primary osteoarthritis, right knee: Secondary | ICD-10-CM

## 2016-06-24 DIAGNOSIS — E559 Vitamin D deficiency, unspecified: Secondary | ICD-10-CM | POA: Diagnosis not present

## 2016-06-24 DIAGNOSIS — S83001A Unspecified subluxation of right patella, initial encounter: Secondary | ICD-10-CM | POA: Diagnosis not present

## 2016-06-24 NOTE — Assessment & Plan Note (Signed)
I believe the patient did have partials patellar subluxation. Bracing given, topical anti-inflammatories, home exercises. Patient has had difficult he with this knee secondary to the underlying chondral malacia. Patient will do a once weekly vitamin D. Follow-up with me again in 4-6 weeks

## 2016-06-24 NOTE — Assessment & Plan Note (Signed)
Reordered prescription for vitamin D

## 2016-06-24 NOTE — Patient Instructions (Signed)
Good to see you  Ice is your friend after activity  Wear brace with working out and tennis  Avoid full extension with working out.  Less instability right now as well with working out.  K2 any dose for next month  Continue once weekly vitamin D See me again in 4 weeks.

## 2016-06-30 DIAGNOSIS — D225 Melanocytic nevi of trunk: Secondary | ICD-10-CM | POA: Diagnosis not present

## 2016-06-30 DIAGNOSIS — L700 Acne vulgaris: Secondary | ICD-10-CM | POA: Diagnosis not present

## 2016-07-13 DIAGNOSIS — J309 Allergic rhinitis, unspecified: Secondary | ICD-10-CM | POA: Diagnosis not present

## 2016-07-13 DIAGNOSIS — R591 Generalized enlarged lymph nodes: Secondary | ICD-10-CM | POA: Diagnosis not present

## 2016-07-16 ENCOUNTER — Other Ambulatory Visit: Payer: Self-pay | Admitting: Family Medicine

## 2016-07-18 NOTE — Telephone Encounter (Signed)
Can we refill this? Did not see on current medication list.

## 2016-07-21 ENCOUNTER — Other Ambulatory Visit: Payer: Self-pay | Admitting: Family Medicine

## 2016-07-21 ENCOUNTER — Encounter: Payer: Self-pay | Admitting: Family Medicine

## 2016-07-21 ENCOUNTER — Ambulatory Visit (INDEPENDENT_AMBULATORY_CARE_PROVIDER_SITE_OTHER): Payer: Commercial Managed Care - PPO | Admitting: Family Medicine

## 2016-07-21 DIAGNOSIS — S83001A Unspecified subluxation of right patella, initial encounter: Secondary | ICD-10-CM

## 2016-07-21 NOTE — Progress Notes (Signed)
[name] [place], [place] [address] Phone: [phone] Subjective:    I'm seeing this patient by the request  of:  [name], MD   CC: Right knee pain follow-up  [id number]  Leslie Hughes is a 41 y.o. female coming in with right knee pain. 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 Patient also had an LCL tear and then seemed to respond to conservative therapy. Patient has done much better and states that she is not having any significant pain but still does not feel like the knee is completely correct. Patient states that it still feels that she moves a certain way that she is going to have another popping sensation with swelling and pain. Patient is playing tennis fairly regularly but has not increase the activity secondary to the concern. Still works out fairly regularly as well with a Clinical research associate.       Past Medical History:  Diagnosis Date  . Allergy   . Cervical dystonia    sees Dr. Tonye Royalty at [facility]   . Degenerative arthritis of right knee 09/10/2015   Started viscous supplementation impression 22nd 2017  . Metrorrhagia    sees Dr. Marylynn Pearson   . Migraine   . Vitamin D deficiency    Past Surgical History:  Procedure Laterality Date  . CESAREAN SECTION  2009  . KNEE SURGERY  1999 and 2008   arthroscopy to right knee (tennis injuries)   Social History  Substance Use Topics  . Smoking status: Never Smoker  . Smokeless tobacco: Never Used  . Alcohol use 0.0 oz/week     Comment: occ   Allergies  Allergen Reactions  . Amoxicillin    Family History  Problem Relation Age of Onset  . Hypothyroidism Mother   . Thyroid disease Mother   . Cancer Father     bladder  . Hyperlipidemia Father   . Hypertension Father   . Skin cancer Paternal Grandmother   . Colon cancer Paternal Grandfather    X-rays were ordered reviewed and interpreted by me today. X-rays the patient's right knee does show bone  demineralization.   Past medical history, social, surgical and family history all reviewed in electronic medical record.   Review of Systems: No headache, visual changes, nausea, vomiting, diarrhea, constipation, dizziness, abdominal pain, skin rash, fevers, chills, night sweats, weight loss, swollen lymph nodes, body aches, joint swelling, muscle aches, chest pain, shortness of breath, mood changes.  .    Objective  Blood pressure 112/82, pulse 80, resp. rate 16, height 5' (1.524 m), weight 137 lb 2 oz (62.2 kg), SpO2 99 %.  Systems examined below as of 07/21/16 General: NAD A&O x3 mood, affect normal  HEENT: Pupils equal, extraocular movements intact no nystagmus Respiratory: not short of breath at rest or with speaking Cardiovascular: No lower extremity edema, non tender Skin: Warm dry intact with no signs of infection or rash on extremities or on axial skeleton. Abdomen: Soft nontender, no masses Neuro: Cranial nerves  intact, neurovascularly intact in all extremities with 2+ DTRs and 2+ pulses. Lymph: No lymphadenopathy appreciated today  Gait normal with good balance and coordination.  MSK: Non tender with full range of motion and good stability and symmetric strength and tone of shoulders, elbows, wrist,  hips and ankles bilaterally.    Knee: Right Patient's lateral translation of the knee Is still noted Minimal tenderness noted on the patella on the lateral aspect but no longer subluxed Ligaments with  solid consistent endpoints including ACL, PCL, LCL, MCL. Negative Mcmurray's, Apley's, and Thessalonian tests. Mild painful patellar compression Patellar glide with mild crepitus but improving. Patellar and quadriceps tendons unremarkable. Hamstring and quadriceps strength is normal. Lateral knee unremarkable      Impression and Recommendations:     This case required medical decision making of moderate complexity.

## 2016-07-21 NOTE — Assessment & Plan Note (Signed)
Discussed with patient again at great length. Patient is going to start increasing activity. On exam today I do not see any swelling and patient's knee seems to be almost in the neutral position. I do not feel that patient needs to have any other significant modalities except for potential compression with a very mild lateral stabilization of the knee. Patient well try and if any type of significant pain comes back she is to come back immediately. Otherwise patient can come back as needed.  Spent  25 minutes with patient face-to-face and had greater than 50% of counseling including as described above in assessment and plan.

## 2016-07-21 NOTE — Progress Notes (Signed)
Pre-visit discussion using our clinic review tool. No additional management support is needed unless otherwise documented below in the visit note.  

## 2016-07-21 NOTE — Patient Instructions (Signed)
Good to see you  [email or web address] Knee compression with patella cut out Size medium  Everything looks great inside the knee.  Go for it.  Write me in 2 weeks and tell me how it is doing.

## 2016-08-14 ENCOUNTER — Other Ambulatory Visit: Payer: Self-pay | Admitting: Family Medicine

## 2016-08-16 NOTE — Telephone Encounter (Signed)
Refill done.  

## 2016-09-12 DIAGNOSIS — R1031 Right lower quadrant pain: Secondary | ICD-10-CM | POA: Diagnosis not present

## 2016-09-12 DIAGNOSIS — N72 Inflammatory disease of cervix uteri: Secondary | ICD-10-CM | POA: Diagnosis not present

## 2016-09-20 ENCOUNTER — Other Ambulatory Visit: Payer: Self-pay | Admitting: Family Medicine

## 2016-09-20 NOTE — Telephone Encounter (Signed)
Call this in for "one each" and 11 rf

## 2016-10-07 ENCOUNTER — Encounter: Payer: Self-pay | Admitting: Family Medicine

## 2016-10-07 ENCOUNTER — Ambulatory Visit (INDEPENDENT_AMBULATORY_CARE_PROVIDER_SITE_OTHER)
Admission: RE | Admit: 2016-10-07 | Discharge: 2016-10-07 | Disposition: A | Discharge status: Home / Self Care | Payer: Commercial Managed Care - PPO | Source: Ambulatory Visit | Attending: Family Medicine | Admitting: Family Medicine

## 2016-10-07 ENCOUNTER — Ambulatory Visit (INDEPENDENT_AMBULATORY_CARE_PROVIDER_SITE_OTHER): Payer: Commercial Managed Care - PPO | Admitting: Family Medicine

## 2016-10-07 VITALS — BP 115/87 | HR 73 | Temp 98.8°F | Ht 60.0 in | Wt 141.0 lb

## 2016-10-07 DIAGNOSIS — R1031 Right lower quadrant pain: Secondary | ICD-10-CM | POA: Diagnosis not present

## 2016-10-07 DIAGNOSIS — R197 Diarrhea, unspecified: Secondary | ICD-10-CM | POA: Diagnosis not present

## 2016-10-07 MED ORDER — IOPAMIDOL (ISOVUE-300) INJECTION 61%: 30.0000 mL | Freq: Once | INTRAVENOUS | Status: DC | PRN

## 2016-10-07 MED ORDER — IOPAMIDOL (ISOVUE-300) INJECTION 61%: 100.0000 mL | Freq: Once | INTRAVENOUS | Status: DC | PRN

## 2016-10-07 NOTE — Progress Notes (Signed)
   Subjective:    Patient ID: Leslie Hughes, female    DOB: January 11, 1976, 41 y.o.   MRN: [id number]  HPI Here for 3 weeks of low grade fevers, nausea without vomiting, poor appetite, diarrhea, and RLQ pain. No urinary symptoms. She saw her GYN 2 weeks ago and they did a pelvic US which was normal. They ruled out any ovarian cysts. They did a CBC and apparently this was normal. They treated her empirically with a week of Doxycycline but this did not help. Her temperature runs form 99 to 100 most of the time. She notes that 8 years ago while living in [place] she had a colonoscopy which showed a type of colitis. This resolved without treatment.    Review of Systems  Constitutional: Positive for appetite change, fatigue and fever.  Respiratory: Negative.   Cardiovascular: Negative.   Gastrointestinal: Positive for abdominal pain, diarrhea and nausea. Negative for abdominal distention, anal bleeding, blood in stool, constipation, rectal pain and vomiting.  Genitourinary: Negative.        Objective:   Physical Exam  Constitutional: She appears well-developed and well-nourished.  Cardiovascular: Normal rate, regular rhythm, normal heart sounds and intact distal pulses.   Pulmonary/Chest: Effort normal and breath sounds normal. No respiratory distress. She has no wheezes. She has no rales.  Abdominal: Soft. Bowel sounds are normal. She exhibits no distension and no mass. There is no rebound and no guarding.  She is moderately tender in the RLQ. No rebound tenderness but heel percussion is positive on the right           Assessment & Plan:  RLQ pain, I am concerned about appendicitis. We will send her for a contrasted CT of the abdomen and pelvis this afternoon.  [name], MD

## 2016-10-07 NOTE — Patient Instructions (Signed)
WE NOW OFFER    [facility]!!!  SAME DAY Appointments for ACUTE CARE  Such as: Sprains, Injuries, cuts, abrasions, rashes, muscle pain, joint pain, back pain Colds, flu, sore throats, headache, allergies, cough, fever  Ear pain, sinus and eye infections Abdominal pain, nausea, vomiting, diarrhea, upset stomach Animal/insect bites  3 Easy Ways to Schedule: Walk-In Scheduling Call in scheduling Mychart Sign-up: [email or web address]         

## 2016-10-10 NOTE — Telephone Encounter (Signed)
Tell her that No the kidney stone is not obstructing anything so it is not causing any symptoms. These just float around without causing any problems

## 2016-11-25 ENCOUNTER — Other Ambulatory Visit: Payer: Self-pay | Admitting: Family Medicine

## 2016-11-28 NOTE — Telephone Encounter (Signed)
Can we refill this? 

## 2016-11-29 DIAGNOSIS — M9901 Segmental and somatic dysfunction of cervical region: Secondary | ICD-10-CM | POA: Diagnosis not present

## 2016-11-29 DIAGNOSIS — M531 Cervicobrachial syndrome: Secondary | ICD-10-CM | POA: Diagnosis not present

## 2016-11-29 DIAGNOSIS — M9902 Segmental and somatic dysfunction of thoracic region: Secondary | ICD-10-CM | POA: Diagnosis not present

## 2016-12-08 ENCOUNTER — Encounter: Payer: Self-pay | Admitting: Family Medicine

## 2016-12-18 ENCOUNTER — Other Ambulatory Visit: Payer: Self-pay | Admitting: Family Medicine

## 2016-12-19 NOTE — Telephone Encounter (Signed)
Refill done.  

## 2017-03-02 ENCOUNTER — Encounter: Payer: Self-pay | Admitting: Family Medicine

## 2017-03-02 ENCOUNTER — Ambulatory Visit: Payer: Commercial Managed Care - PPO | Admitting: Family Medicine

## 2017-03-02 VITALS — BP 122/68 | HR 82 | Temp 98.3°F | Ht 60.0 in | Wt 139.0 lb

## 2017-03-02 DIAGNOSIS — M898X6 Other specified disorders of bone, lower leg: Secondary | ICD-10-CM | POA: Insufficient documentation

## 2017-03-02 NOTE — Assessment & Plan Note (Addendum)
Pain seems to be associated with a stress reaction of the distal tibia. No fracture appreciated. 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  - counseled on compression  - counseled to decreased the level of activity  - follow up in 3 weeks. If no improvement, can consider xray, MRI and/or PT

## 2017-03-02 NOTE — Progress Notes (Signed)
Leslie Hughes - 41 y.o. female MRN [id number]  Date of birth: 13-Dec-1975  SUBJECTIVE:  Including CC & ROS.  Chief Complaint  Patient presents with  . Left lower leg pain    Leslie Hughes is a 41 y.o. female that is here for an evaluation for left lower leg pain. Pain is located in the lower anterior aspect of her shin. The pain started 3 weeks ago. She does play tennis daily, she has been limiting her activity to help with the pain. Describes the pain as sharp. Denies injury or surgeries to the area. She did wear a compression sleeve with no improvement with the pain. She has had shin splints previously. She has not played tennis over the past week and the pain still persists. The pain is worse with walking. The pain is localized to the area. Denies any trauma to the area. The pain is severe in nature. This pain is sharp in nature. Reports irregular periods and has been on oral contraceptive. Reports to having a regular diet. Has been wearing a compression sleeve on her calf. Has worn orthotics in her shoe.   Review of her Vitamin D from 2013 is 26.  Independent review of her tib-fib xray left on 02/17/15 is normal.   Review of Systems  Constitutional: Negative for fever.  Respiratory: Negative for shortness of breath.   Cardiovascular: Negative for chest pain.  Musculoskeletal: Negative for gait problem and joint swelling.  Skin: Negative for color change.  Neurological: Negative for weakness and numbness.  Hematological: Negative for adenopathy.  Psychiatric/Behavioral: Negative for agitation.    HISTORY: Past Medical, Surgical, Social, and Family History Reviewed & Updated per EMR.   Pertinent Historical Findings include:  Past Medical History:  Diagnosis Date  . Allergy   . Cervical dystonia    sees Dr. Tonye Royalty at [facility]   . Degenerative arthritis of right knee 09/10/2015   Started viscous supplementation impression 22nd 2017  . Metrorrhagia    sees Dr. Marylynn Pearson    . Migraine   . Vitamin D deficiency     Past Surgical History:  Procedure Laterality Date  . CESAREAN SECTION  2009  . [place] and 2008   arthroscopy to right knee (tennis injuries)    Allergies  Allergen Reactions  . Amoxicillin     Family History  Problem Relation Age of Onset  . Hypothyroidism Mother   . Thyroid disease Mother   . Cancer Father        bladder  . Hyperlipidemia Father   . Hypertension Father   . Skin cancer Paternal Grandmother   . Colon cancer Paternal Grandfather      Social History   Socioeconomic History  . Marital status: Married    Spouse name: Not on file  . Number of children: Not on file  . Years of education: Not on file  . Highest education level: Not on file  Social Needs  . Financial resource strain: Not on file  . Food insecurity - worry: Not on file  . Food insecurity - inability: Not on file  . Transportation needs - medical: Not on file  . Transportation needs - non-medical: Not on file  Occupational History  . Not on file  Tobacco Use  . Smoking status: Never Smoker  . Smokeless tobacco: Never Used  Substance and Sexual Activity  . Alcohol use: Yes    Alcohol/week: 0.0 oz    Comment: occ  . Drug use: No  .  Sexual activity: Not on file  Other Topics Concern  . Not on file  Social History Narrative  . Not on file     PHYSICAL EXAM:  VS: BP 122/68 (BP Location: Left Arm, Patient Position: Sitting, Cuff Size: Normal)   Pulse 82   Temp 98.3 F (36.8 C) (Oral)   Ht 5' (1.524 m)   Wt 139 lb (63 kg)   SpO2 99%   BMI 27.15 kg/m  Physical Exam Gen: NAD, alert, cooperative with exam, well-appearing ENT: normal lips, normal nasal mucosa,  Eye: normal EOM, normal conjunctiva and lids CV:  no edema, +2 pedal pulses   Resp: no accessory muscle use, non-labored,  Skin: no rashes, no areas of induration  Neuro: normal tone, normal sensation to touch Psych:  normal insight, alert and oriented MSK:  Left  lower leg: Normal knee flexion and extension. Normal ankle flexion and extension. No pain to resistance with left great toe flexion or extension Significant tenderness to palpation over the left distal medial tibia No tenderness to palpation of the medial malleolus. Neurovascularly intact  Limited ultrasound: Left leg:  No step-off appreciated within the bone architecture. There appears to be book or changes to suggest an effusion over the distal tibia. No effusion associated with the joint line of the ankle.  Summary: Findings are suggestive of a stress fracture of the distal tibia  Ultrasound and interpretation by Clearance Coots, MD          ASSESSMENT & PLAN:   Pain in left tibia Pain seems to be associated with a stress reaction of the distal tibia. No fracture appreciated. 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  - counseled on compression  - counseled to decreased the level of activity  - follow up in 3 weeks. If no improvement, can consider xray, MRI and/or PT

## 2017-03-02 NOTE — Patient Instructions (Signed)
Thank you for coming in,   Please follow up with me in 3-4 weeks.   Please let me know if you would like to try some physical therapy.     Please feel free to call with any questions or concerns at any time, at [phone]. --Dr. Raeford Razor

## 2017-03-10 DIAGNOSIS — M9901 Segmental and somatic dysfunction of cervical region: Secondary | ICD-10-CM | POA: Diagnosis not present

## 2017-03-10 DIAGNOSIS — M9902 Segmental and somatic dysfunction of thoracic region: Secondary | ICD-10-CM | POA: Diagnosis not present

## 2017-03-10 DIAGNOSIS — M531 Cervicobrachial syndrome: Secondary | ICD-10-CM | POA: Diagnosis not present

## 2017-03-23 ENCOUNTER — Ambulatory Visit: Payer: Commercial Managed Care - PPO | Admitting: Family Medicine

## 2017-04-17 ENCOUNTER — Ambulatory Visit: Payer: Commercial Managed Care - PPO | Admitting: Family Medicine

## 2017-04-17 ENCOUNTER — Encounter: Payer: Self-pay | Admitting: Family Medicine

## 2017-04-17 VITALS — BP 104/70 | HR 87 | Temp 98.4°F | Wt 142.6 lb

## 2017-04-17 DIAGNOSIS — M542 Cervicalgia: Secondary | ICD-10-CM | POA: Diagnosis not present

## 2017-04-17 DIAGNOSIS — J069 Acute upper respiratory infection, unspecified: Secondary | ICD-10-CM

## 2017-04-17 MED ORDER — MELOXICAM 15 MG PO TABS: 15.0000 mg | ORAL_TABLET | Freq: Every day | ORAL | 3 refills | Status: DC

## 2017-04-17 NOTE — Progress Notes (Signed)
   Subjective:    Patient ID: Leslie Hughes, female    DOB: [birth year], 42 y.o.   MRN: [id number]  HPI Here for several issues. First she has had chronic neck pain for several years and she had seen a chiropractor through the [facility] for years for massages and deep tissue releases. Her pain had been worse lately so one day last week he attempted a manipulation on her neck for the first time. The next day the spasms and pain were a lot worse and she does not plan on seeing him again. The pain radiates into both upper arm, no numbness or weakness. Using Zanaflex at night. Also for 4 days she has had stuffy head, PND, ST, and a dry cough.    Review of Systems  Constitutional: Negative.   HENT: Positive for congestion, postnasal drip, sinus pressure and sore throat. Negative for sinus pain.   Eyes: Negative.   Respiratory: Positive for cough.   Musculoskeletal: Positive for neck pain and neck stiffness.       Objective:   Physical Exam  Constitutional: She is oriented to person, place, and time. She appears well-developed and well-nourished.  HENT:  Right Ear: External ear normal.  Left Ear: External ear normal.  Nose: Nose normal.  Mouth/Throat: Oropharynx is clear and moist.  Eyes: Conjunctivae are normal.  Neck: No thyromegaly present.  Pulmonary/Chest: Effort normal and breath sounds normal. No respiratory distress. She has no wheezes. She has no rales.  Musculoskeletal:  She is tender at the base of the posterior neck and into both superior trapezius muscles. ROM is limited.   Lymphadenopathy:    She has no cervical adenopathy.  Neurological: She is alert and oriented to person, place, and time.          Assessment & Plan:  For the viral URI she will drink fluids and take Mucinex D prn. For the neck pain we will refer her to see Dr. Charlann Boxer in Sports Medicine.  [name], MD

## 2017-04-19 ENCOUNTER — Telehealth: Payer: Self-pay | Admitting: Family Medicine

## 2017-04-19 MED ORDER — HYDROCODONE-HOMATROPINE 5-1.5 MG/5ML PO SYRP: 5.0000 mL | ORAL_SOLUTION | ORAL | 0 refills | Status: DC | PRN

## 2017-04-19 MED ORDER — AZITHROMYCIN 250 MG PO TABS: ORAL_TABLET | ORAL | 0 refills | Status: DC

## 2017-04-19 NOTE — Telephone Encounter (Signed)
Pt notified of instructions and verbalized understanding. 

## 2017-04-19 NOTE — Telephone Encounter (Signed)
Copied from [place] [id number]. Topic: Quick Communication - See Telephone Encounter >> Apr 19, 2017  9:56 AM Synthia Innocent wrote: CRM for notification. See Telephone encounter for:  Saw Dr Sarajane Jews on Monday, was told to call back if she was not any better, patient states she is worst, requesting antibiotic and cough meds. [facility] [address] [place] Please advise 04/19/17.

## 2017-04-19 NOTE — Telephone Encounter (Signed)
Sent in

## 2017-04-19 NOTE — Telephone Encounter (Signed)
Sent to PCP ?

## 2017-04-30 NOTE — Progress Notes (Signed)
[facility] [place] [place], [place] [address] Phone: [phone] Subjective:    I'm seeing this patient by the request  of:  Laurey Morale, MD   CC: Neck pain  [id number]  Leslie Hughes is a 42 y.o. female coming in with complaint of neck pain, has had this for several years.  Has seen a chiropractor.  Patient though did have manipulation recently and had spasms in her neck.  Was using a muscle relaxer. She has been having pain the week before Thanksgiving. She is experiencing tingling down the right arm especially during cycling class. She uses mobic for her pain when it gets really bad.  Patient since then has not noticed weakness but states that she has been dropping some things with the right hand recently.  Has noticed that her grip with tennis is not been as much of a benefit either.  She also states that she continues to have shin splints on the left leg.      Past Medical History:  Diagnosis Date  . Allergy   . Cervical dystonia    sees Dr. Tonye Royalty at [facility]   . Degenerative arthritis of right knee 09/10/2015   Started viscous supplementation impression 22nd 2017  . Metrorrhagia    sees Dr. Marylynn Pearson   . Migraine   . Vitamin D deficiency    Past Surgical History:  Procedure Laterality Date  . CESAREAN SECTION  2009  . [place] and 2008   arthroscopy to right knee (tennis injuries)   Social History   Socioeconomic History  . Marital status: Married    Spouse name: None  . Number of children: None  . Years of education: None  . Highest education level: None  Social Needs  . Financial resource strain: None  . Food insecurity - worry: None  . Food insecurity - inability: None  . Transportation needs - medical: None  . Transportation needs - non-medical: None  Occupational History  . None  Tobacco Use  . Smoking status: Never Smoker  . Smokeless tobacco: Never Used  Substance and Sexual Activity    . Alcohol use: Yes    Alcohol/week: 0.0 oz    Comment: occ  . Drug use: No  . Sexual activity: None  Other Topics Concern  . None  Social History Narrative  . None   Allergies  Allergen Reactions  . Amoxicillin    Family History  Problem Relation Age of Onset  . Hypothyroidism Mother   . Thyroid disease Mother   . Cancer Father        bladder  . Hyperlipidemia Father   . Hypertension Father   . Skin cancer Paternal Grandmother   . Colon cancer Paternal Grandfather      Past medical history, social, surgical and family history all reviewed in electronic medical record.  No pertanent information unless stated regarding to the chief complaint.   Review of Systems:Review of systems updated and as accurate as of 05/01/17  No headache, visual changes, nausea, vomiting, diarrhea, constipation, dizziness, abdominal pain, skin rash, fevers, chills, night sweats, weight loss, swollen lymph nodes, body aches, joint swelling, muscle aches, chest pain, shortness of breath, mood changes.   Objective  Blood pressure 110/82, pulse 74, height 5\' 5"  (1.651 m), weight 136 lb (61.7 kg), SpO2 95 %. Systems examined below as of 05/01/17   General: No apparent distress alert and oriented x3 mood and affect normal, dressed  appropriately.  HEENT: Pupils equal, extraocular movements intact  Respiratory: Patient's speak in full sentences and does not appear short of breath  Cardiovascular: No lower extremity edema, non tender, no erythema  Skin: Warm dry intact with no signs of infection or rash on extremities or on axial skeleton.  Abdomen: Soft nontender  Neuro: Cranial nerves II through XII are intact, neurovascularly intact in all extremities with 2+ DTRs and 2+ pulses.  Lymph: No lymphadenopathy of posterior or anterior cervical chain or axillae bilaterally.  Gait normal with good balance and coordination.  MSK:  Non tender with full range of motion and good stability and symmetric strength  and tone of shoulders, elbows, wrist, hip, knee and ankles bilaterally.  Neck: Inspection loss of lordosis and patient does have swelling noted at the C7-T1 area of the neck.. No palpable stepoffs. Positive Spurling's maneuver on the right. Patient does have some weakness with grip strength.  Patient has weakness in the C6 distribution significantly with 3 out of 5 strength compared to the contralateral side and 4 out of 5 strength on the right in the C8 distribution Negative Hoffman sign bilaterally Reflexes normal  [id number]; 15 additional minutes spent for Therapeutic exercises as stated in above notes.  This included exercises focusing on stretching, strengthening, with significant focus on eccentric aspects.   Long term goals include an improvement in range of motion, strength, endurance as well as avoiding reinjury. Patient's frequency would include in 1-2 times a day, 3-5 times a week for a duration of 6-12 weeks posture and ergonomic exercises given.  Working on scapular dyskinesis with ergonomic changes.  Proper technique shown and discussed handout in great detail with ATC.  All questions were discussed and answered.     Impression and Recommendations:     This case required medical decision making of moderate complexity.      Note: This dictation was prepared with Dragon dictation along with smaller phrase technology. Any transcriptional errors that result from this process are unintentional.

## 2017-05-01 ENCOUNTER — Encounter: Payer: Self-pay | Admitting: Family Medicine

## 2017-05-01 ENCOUNTER — Ambulatory Visit (INDEPENDENT_AMBULATORY_CARE_PROVIDER_SITE_OTHER)
Admission: RE | Admit: 2017-05-01 | Discharge: 2017-05-01 | Disposition: A | Discharge status: Home / Self Care | Payer: Commercial Managed Care - PPO | Source: Ambulatory Visit | Attending: Family Medicine | Admitting: Family Medicine

## 2017-05-01 ENCOUNTER — Ambulatory Visit (INDEPENDENT_AMBULATORY_CARE_PROVIDER_SITE_OTHER): Payer: Commercial Managed Care - PPO | Admitting: Family Medicine

## 2017-05-01 VITALS — BP 110/82 | HR 74 | Ht 65.0 in | Wt 136.0 lb

## 2017-05-01 DIAGNOSIS — M5412 Radiculopathy, cervical region: Secondary | ICD-10-CM | POA: Diagnosis not present

## 2017-05-01 DIAGNOSIS — M542 Cervicalgia: Secondary | ICD-10-CM

## 2017-05-01 MED ORDER — PREDNISONE 50 MG PO TABS: 50.0000 mg | ORAL_TABLET | Freq: Every day | ORAL | 0 refills | Status: DC

## 2017-05-01 MED ORDER — VENLAFAXINE HCL ER 37.5 MG PO CP24: 37.5000 mg | ORAL_CAPSULE | Freq: Every day | ORAL | 1 refills | Status: DC

## 2017-05-01 NOTE — Patient Instructions (Addendum)
Good to see you  Leslie Hughes is your friend. Ice 20 minutes 2 times daily. Usually after activity and before bed. Effexor 37.5 mg daily  Prednisone daily for 5 days Exercises 3 times a week.  Keep hands within peripheral vision  For the leg stop the K2 After the prednisone start aspirin 81-162mg  daily  Continue the compression  See me again I n7-10 days

## 2017-05-01 NOTE — Assessment & Plan Note (Signed)
Patient is having cervical radiculopathy from C6 through C8.  Patient does have weakness in the corresponding features.  We discussed x-rays today.  Prednisone given, discussed icing regimen.  Nexium given for a neurologic component.  Worsening symptoms advanced imaging would be warranted especially if there is increasing and weakness.  At this time deep tendon reflexes are intact.  Home exercises given today.

## 2017-05-02 ENCOUNTER — Encounter: Payer: Self-pay | Admitting: Family Medicine

## 2017-05-09 NOTE — Progress Notes (Signed)
[name] [address] [place], [place] [address] Phone: [phone] Subjective:    I'm seeing this patient by the request  of:    CC: Neck pain follow-up  [id number]  Leslie Hughes is a 42 y.o. female coming in with complaint of cervical pain. She feels that same as last visit. She did not feel that the prednisone worked. She said that her right arm is still weak. She played tennis yesterday and was frustrated by the weakness.  Patient was found to have a potential injury after seeing a chiropractor.  Did have weakness in the C6 distribution.  Continues to have the weakness.  Feels that the prednisone did not help the pain but may have helped some of the weakness though.    Past Medical History:  Diagnosis Date  . Allergy   . Cervical dystonia    sees Dr. Tonye Royalty at [facility]   . Degenerative arthritis of right knee 09/10/2015   Started viscous supplementation impression 22nd 2017  . Metrorrhagia    sees Dr. Marylynn Pearson   . Migraine   . Vitamin D deficiency    Past Surgical History:  Procedure Laterality Date  . CESAREAN SECTION  2009  . [place] and 2008   arthroscopy to right knee (tennis injuries)   Social History   Socioeconomic History  . Marital status: Married    Spouse name: None  . Number of children: None  . Years of education: None  . Highest education level: None  Social Needs  . Financial resource strain: None  . Food insecurity - worry: None  . Food insecurity - inability: None  . Transportation needs - medical: None  . Transportation needs - non-medical: None  Occupational History  . None  Tobacco Use  . Smoking status: Never Smoker  . Smokeless tobacco: Never Used  Substance and Sexual Activity  . Alcohol use: Yes    Alcohol/week: 0.0 oz    Comment: occ  . Drug use: No  . Sexual activity: None  Other Topics Concern  . None  Social History Narrative  . None   Allergies  Allergen  Reactions  . Amoxicillin    Family History  Problem Relation Age of Onset  . Hypothyroidism Mother   . Thyroid disease Mother   . Cancer Father        bladder  . Hyperlipidemia Father   . Hypertension Father   . Skin cancer Paternal Grandmother   . Colon cancer Paternal Grandfather      Past medical history, social, surgical and family history all reviewed in electronic medical record.  No pertanent information unless stated regarding to the chief complaint.   Review of Systems:Review of systems updated and as accurate as of 05/10/17  No headache, visual changes, nausea, vomiting, diarrhea, constipation, dizziness, abdominal pain, skin rash, fevers, chills, night sweats, weight loss, swollen lymph nodes, body aches, joint swelling,  chest pain, shortness of breath, mood changes.  Positive muscle aches  Objective  Blood pressure 100/78, pulse 84, height 5\' 5"  (1.651 m), weight 140 lb 12.8 oz (63.9 kg), SpO2 99 %. Systems examined below as of 05/10/17   General: No apparent distress alert and oriented x3 mood and affect normal, dressed appropriately.  HEENT: Pupils equal, extraocular movements intact  Respiratory: Patient's speak in full sentences and does not appear short of breath  Cardiovascular: No lower extremity edema, non tender, no erythema  Skin: Warm dry intact with  no signs of infection or rash on extremities or on axial skeleton.  Abdomen: Soft nontender  Neuro: Cranial nerves II through XII are intact, neurovascularly intact in all extremities with 2+ DTRs and 2+ pulses.  Lymph: No lymphadenopathy of posterior or anterior cervical chain or axillae bilaterally.  Gait normal with good balance and coordination.  MSK:  Non tender with full range of motion and good stability and symmetric strength and tone of shoulders, elbows, wrist, hip, knee and ankles bilaterally.  Neck: Inspection mild loss of lordosis. No palpable stepoffs. Mild positive Spurling's maneuver. Mild  loss of extension and right-sided side bending Still has some weakness in the C5 through C7 distribution on the right side better than previous exam No sensory change to C4 to T1 Negative Hoffman sign bilaterally Reflexes normal  Osteopathic findings C2 flexed rotated and side bent right C4 flexed rotated and side bent left C6 flexed rotated and side bent right  T3 extended rotated and side bent right inhaled third rib      Impression and Recommendations:     This case required medical decision making of moderate complexity.      Note: This dictation was prepared with Dragon dictation along with smaller phrase technology. Any transcriptional errors that result from this process are unintentional.

## 2017-05-10 ENCOUNTER — Encounter: Payer: Self-pay | Admitting: Family Medicine

## 2017-05-10 ENCOUNTER — Ambulatory Visit: Payer: Commercial Managed Care - PPO | Admitting: Family Medicine

## 2017-05-10 VITALS — BP 100/78 | HR 84 | Ht 65.0 in | Wt 140.8 lb

## 2017-05-10 DIAGNOSIS — M5412 Radiculopathy, cervical region: Secondary | ICD-10-CM | POA: Diagnosis not present

## 2017-05-10 DIAGNOSIS — M999 Biomechanical lesion, unspecified: Secondary | ICD-10-CM | POA: Diagnosis not present

## 2017-05-10 NOTE — Patient Instructions (Addendum)
Good to see you  I am glad the strength is a little better PT will be good  Continue everything else we are ding See me again in 3 weeks

## 2017-05-10 NOTE — Assessment & Plan Note (Signed)
Decision today to treat with OMT was based on Physical Exam  After verbal consent patient was treated with  ME, FPR techniques in cervical, thoracic,rib areas  Patient tolerated the procedure well with improvement in symptoms  Patient given exercises, stretches and lifestyle modifications  See medications in patient instructions if given  Patient will follow up in 3 weeks

## 2017-05-10 NOTE — Assessment & Plan Note (Signed)
Some improvement with the prednisone, attempted light manipulation  Discussed worsening symptoms will need MRI

## 2017-05-23 ENCOUNTER — Ambulatory Visit: Payer: Commercial Managed Care - PPO | Attending: Gastroenterology | Admitting: Physical Therapy

## 2017-05-23 ENCOUNTER — Other Ambulatory Visit: Payer: Self-pay

## 2017-05-23 DIAGNOSIS — M542 Cervicalgia: Secondary | ICD-10-CM | POA: Diagnosis present

## 2017-05-23 DIAGNOSIS — R293 Abnormal posture: Secondary | ICD-10-CM

## 2017-05-23 DIAGNOSIS — M6281 Muscle weakness (generalized): Secondary | ICD-10-CM | POA: Diagnosis present

## 2017-05-23 DIAGNOSIS — M5412 Radiculopathy, cervical region: Secondary | ICD-10-CM | POA: Insufficient documentation

## 2017-05-23 NOTE — Therapy (Addendum)
[facility] [address]  [place], [place], [address] Phone: [phone]   Fax:  [phone]  Physical Therapy Evaluation  Patient Details  Name: Leslie Hughes MRN: [id number] Date of Birth: 02/21/1976 Referring Provider: Hulan Saas, DO   Encounter Date: 05/23/2017  PT End of Session - 05/23/17 0845    Visit Number  1    Number of Visits  8    Date for PT Re-Evaluation  06/23/17    Authorization Type  [facility] (not [facility])    PT Start Time  0845    PT Stop Time  0932    PT Time Calculation (min)  47 min    Activity Tolerance  Patient tolerated treatment well    Behavior During Therapy  [facility] for tasks assessed/performed       Past Medical History:  Diagnosis Date  . Allergy   . Cervical dystonia    sees Dr. Tonye Royalty at [facility]   . Degenerative arthritis of right knee 09/10/2015   Started viscous supplementation impression 22nd 2017  . Metrorrhagia    sees Dr. Marylynn Pearson   . Migraine   . Vitamin D deficiency     Past Surgical History:  Procedure Laterality Date  . CESAREAN SECTION  2009  . [place] and 2008   arthroscopy to right knee (tennis injuries)    There were no vitals filed for this visit.   Subjective Assessment - 05/23/17 0847    Subjective  Pt noted onset of swelling around her neck starting the week before Christmas, which was associated with R UE weakness. Onset the day following cervical manipulation by her chiropractor. Has long h/o diagnosis of cervical dystonia.    Patient Stated Goals  "I want my strength back in my R hand so I can avoid the ESI in my neck"    Currently in Pain?  Yes    Pain Score  4     Pain Location  Neck    Pain Orientation  Lower    Pain Descriptors / Indicators  Constant;Tightness;Pressure    Pain Type  Acute pain;Chronic pain    Pain Radiating Towards  intermittent pain/numbess/tingling down R UE to hand; R UE weakness    Pain Onset  More  than a month ago    Pain Frequency  Intermittent    Aggravating Factors   playing tennis, increased activity    Pain Relieving Factors  rest    Effect of Pain on Daily Activities  more difficulty playing tennis or riding peloton bike         [facility] PT Assessment - 05/23/17 0845      Assessment   Medical Diagnosis  Cervical radiculopathy    Referring Provider  Hulan Saas, DO    Onset Date/Surgical Date  -- mid December    Next MD Visit  05/31/17    Prior Therapy  remote h/o PT for neck      Balance Screen   Has the patient fallen in the past 6 months  No    Has the patient had a decrease in activity level because of a fear of falling?   No    Is the patient reluctant to leave their home because of a fear of falling?   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, some travel    Leisure  tennis, cycling, hiking, works out with trainer 2x/wk      Observation/Other Assessments   Focus on Therapeutic Outcomes (FOTO)   Neck - 68% (32% limitation); predicted 74% (26% limitation)      Posture/Postural Control   Posture/Postural Control  Postural limitations    Postural Limitations  Rounded Shoulders;Forward head      ROM / Strength   AROM / PROM / Strength  AROM;Strength      AROM   Overall AROM Comments  B shoulder ROM WNL    AROM Assessment Site  Cervical;Shoulder    Cervical Flexion  72    Cervical Extension  61    Cervical - Right Side Bend  41    Cervical - Left Side Bend  40    Cervical - Right Rotation  62    Cervical - Left Rotation  80      Strength   Strength Assessment Site  Shoulder;Elbow;Hand    Right/Left Shoulder  Right;Left    Right Shoulder Flexion  4-/5    Right Shoulder ABduction  4-/5    Right Shoulder Internal Rotation  4/5    Right Shoulder External Rotation  4/5     Left Shoulder Flexion  4+/5    Left Shoulder ABduction  4+/5    Left Shoulder Internal Rotation  4+/5    Left Shoulder External Rotation  4+/5    Right/Left Elbow  Right;Left    Right Elbow Flexion  4/5    Right Elbow Extension  4/5    Left Elbow Flexion  4+/5    Left Elbow Extension  4+/5    Right/Left hand  Right;Left    Right Hand Grip (lbs)  55, 61, 55    Right Hand Lateral Pinch  -- 16, 15, 14    Right Hand 3 Point Pinch  -- 14, 13, 14    Left Hand Grip (lbs)  60, 66, 59    Left Hand Lateral Pinch  -- 15, 14, 15    Left Hand 3 Point Pinch  -- 15, 14, 14      Palpation   Palpation comment  ttp with increased muscle tension over B cervical paraspinals, UT, LS & posterior shoulder capsule esp teres group             Objective measurements completed on examination: See above findings.      [facility] PT Treatment/Exercise - 05/23/17 0845      Exercises   Exercises  Neck      Neck Exercises: Theraband   Scapula Retraction  15 reps;Green    Scapula Retraction Limitations  standing + mini shoulder extension    Rows  15 reps;Green    Rows Limitations  standing      Neck Exercises: Stretches   Corner Stretch  30 seconds;1 rep    Corner Stretch Limitations  3 way doorway stretch    Other Neck Stretches  B shoulder posterior capsule stretch x30"             PT Education - 05/23/17 0932    Education provided  Yes    Education Details  PT eval findings, anticipated POC & initial HEP    Person(s) Educated  Patient    Methods  Explanation;Demonstration;Handout    Comprehension  Verbalized understanding;Returned demonstration  PT Long Term Goals - 05/23/17 0932      PT LONG TERM GOAL #1   Title  Independent with ongoing/advanced HEP    Status  New    Target Date  06/23/17      PT LONG TERM GOAL #2   Title  R cervical rotation AROM equivalent to L    Status  New    Target Date  06/23/17      PT LONG TERM GOAL #3   Title  R shoulder  strength >/= 4+/5     Status  New    Target Date  06/23/17      PT LONG TERM GOAL #4   Title  R grip strength equivalent to or greater than L    Status  New    Target Date  06/23/17      PT LONG TERM GOAL #5   Title  Pt will report ability to play tennis or ride peloton bike w/o limitation due to R UE pain or weakness    Status  New    Target Date  06/23/17             Plan - 05/23/17 0932    Clinical Impression Statement  Leslie Hughes is a 42 y/o R hand dominant female who presents to OP PT for R cervical radiculopathy with R UE weakness following chiropractic adjustment in mid Dec 2018. Pt reports symptoms have improved following prednisone but still noting some swelling in her neck and ongoing R UE weakness, proximal > distal. Pt demonstrates postural abnormalities including forward head and rounded shoulders, abnormal muscle tension in B UT, LS, cervical paraspinals and posterior capsule esp teres group (R>L) with moderate impaired flexibility, and mildly decreased cervical AROM into R rotation. Pt will benefit from skilled PT to address deficits listed to decrease pain interference with daily tasks and restore normal strength in R UE.    History and Personal Factors relevant to plan of care:  long h/o cervical dystonia, migraines    Clinical Presentation  Stable    Clinical Decision Making  Low    Rehab Potential  Good    PT Frequency  2x / week    PT Duration  4 weeks    PT Treatment/Interventions  Patient/family education;Neuromuscular re-education;ADLs/Self Care Home Management;Therapeutic exercise;Therapeutic activities;Moist Heat;Electrical Stimulation;Cryotherapy;Iontophoresis '4mg'$ /ml Dexamethasone;Traction;Ultrasound;Manual techniques;Passive range of motion;Dry needling;Taping    Consulted and Agree with Plan of Care  Patient       Patient will benefit from skilled therapeutic intervention in order to improve the following deficits and impairments:  Pain, Postural  dysfunction, Impaired flexibility, Increased muscle spasms, Decreased strength, Impaired UE functional use, Decreased range of motion  Visit Diagnosis: Radiculopathy, cervical region  Cervicalgia  Abnormal posture  Muscle weakness (generalized)     Problem List Patient Active Problem List   Diagnosis Date Noted  . Nonallopathic lesion of cervical region 05/10/2017  . Nonallopathic lesion of thoracic region 05/10/2017  . Nonallopathic lesion of rib cage 05/10/2017  . Cervical radiculopathy at C6 05/01/2017  . Pain in left tibia 03/02/2017  . Patellar subluxation, right, initial encounter 06/24/2016  . Tear of LCL (lateral collateral ligament) of knee, right, initial encounter 12/31/2015  . Sprain, quadricep 12/31/2015  . Degenerative arthritis of right knee 09/10/2015  . Leg length discrepancy 06/23/2015  . Medial tibial stress syndrome 01/13/2015  . Right knee pain 01/12/2015  . Vitamin D deficiency 09/19/2011  . Migraines 09/09/2010  . Cervical dystonia 09/09/2010  . Metrorrhagia  09/09/2010    Percival Spanish, PT, MPT 05/23/2017, 12:06 PM  [facility] [address]  [place], [place], [address] Phone: [phone]   Fax:  [phone]  Name: Leslie Hughes MRN: [id number] Date of Birth: 19-Feb-1976   PHYSICAL THERAPY DISCHARGE SUMMARY  Visits from Start of Care: 1  Current functional level related to goals / functional outcomes:   Refer to above clinical impression as pt unable to return for further visits due to family emergency.   Remaining deficits:   As above.    Education / Equipment:   Initial HEP  Plan: Patient agrees to discharge.  Patient goals were not met. Patient is being discharged due to not returning since the last visit.  ?????    Percival Spanish, PT, MPT 07/07/17, 9:53 AM  [facility] [address]  [place], [place], [address] Phone: [phone]   Fax:  [phone]

## 2017-05-29 ENCOUNTER — Ambulatory Visit: Payer: Commercial Managed Care - PPO | Admitting: Physical Therapy

## 2017-05-29 ENCOUNTER — Other Ambulatory Visit: Payer: Self-pay | Admitting: Family Medicine

## 2017-05-30 NOTE — Telephone Encounter (Signed)
Last OV 04/17/2017   Last refilled 11/29/2016 disp 90 with 5 refills   Sent to PCP for approval

## 2017-05-30 NOTE — Progress Notes (Signed)
[name] [place], [place] [address] Phone: [phone] Subjective:     CC: Neck pain follow-up  [id number]  Leslie Hughes is a 42 y.o. female coming in with complaint of neck pain.  Was found to have more of a cervical radiculopathy.  Responded well to prednisone as well as Effexor.  Patient was doing better and we started osteopathic manipulation as well as formal physical therapy.  Patient states that she has had a little improvement. She did play yesterday and noticed that she had more strength.     Past Medical History:  Diagnosis Date  . Allergy   . Cervical dystonia    sees Dr. Tonye Royalty at [facility]   . Degenerative arthritis of right knee 09/10/2015   Started viscous supplementation impression 22nd 2017  . Metrorrhagia    sees Dr. Marylynn Pearson   . Migraine   . Vitamin D deficiency    Past Surgical History:  Procedure Laterality Date  . CESAREAN SECTION  2009  . [place] and 2008   arthroscopy to right knee (tennis injuries)   Social History   Socioeconomic History  . Marital status: Married    Spouse name: Not on file  . Number of children: Not on file  . Years of education: Not on file  . Highest education level: Not on file  Social Needs  . Financial resource strain: Not on file  . Food insecurity - worry: Not on file  . Food insecurity - inability: Not on file  . Transportation needs - medical: Not on file  . Transportation needs - non-medical: Not on file  Occupational History  . Not on file  Tobacco Use  . Smoking status: Never Smoker  . Smokeless tobacco: Never Used  Substance and Sexual Activity  . Alcohol use: Yes    Alcohol/week: 0.0 oz    Comment: occ  . Drug use: No  . Sexual activity: Not on file  Other Topics Concern  . Not on file  Social History Narrative  . Not on file   Allergies  Allergen Reactions  . Amoxicillin    Family History  Problem Relation Age of Onset    . Hypothyroidism Mother   . Thyroid disease Mother   . Cancer Father        bladder  . Hyperlipidemia Father   . Hypertension Father   . Skin cancer Paternal Grandmother   . Colon cancer Paternal Grandfather      Past medical history, social, surgical and family history all reviewed in electronic medical record.  No pertanent information unless stated regarding to the chief complaint.   Review of Systems:Review of systems updated and as accurate as of 05/31/17  No  visual changes, nausea, vomiting, diarrhea, constipation, dizziness, abdominal pain, skin rash, fevers, chills, night sweats, weight loss, swollen lymph nodes, body aches, joint swelling,  chest pain, shortness of breath, mood changes.  Mild positive muscle aches and headache  Objective  Blood pressure 116/82, pulse 84, height 5\' 5"  (1.651 m), weight 143 lb (64.9 kg), SpO2 98 %. Systems examined below as of 05/31/17   General: No apparent distress alert and oriented x3 mood and affect normal, dressed appropriately.  HEENT: Pupils equal, extraocular movements intact  Respiratory: Patient's speak in full sentences and does not appear short of breath  Cardiovascular: No lower extremity edema, non tender, no erythema  Skin: Warm dry intact with no signs of infection or rash  on extremities or on axial skeleton.  Abdomen: Soft nontender  Neuro: Cranial nerves II through XII are intact, neurovascularly intact in all extremities with 2+ DTRs and 2+ pulses.  Lymph: No lymphadenopathy of posterior or anterior cervical chain or axillae bilaterally.  Gait normal with good balance and coordination.  MSK:  Non tender with full range of motion and good stability and symmetric strength and tone of shoulders, elbows, wrist, hip, knee and ankles bilaterally.  Neck: Inspection mild loss of lordosis. No palpable stepoffs. Negative Spurling's maneuver. Mild loss of range of motion Grip strength and sensation normal in bilateral  hands Strength good C4 to T1 distribution improvement.  No sensory change to C4 to T1 Negative Hoffman sign bilaterally Reflexes normal   Osteopathic findings C2 flexed rotated and side bent right C4 flexed rotated and side bent left T3 extended rotated and side bent right inhaled third rib T5 extended rotated and side bent left L2 flexed rotated and side bent right Sacrum right on right    Impression and Recommendations:     This case required medical decision making of moderate complexity.      Note: This dictation was prepared with Dragon dictation along with smaller phrase technology. Any transcriptional errors that result from this process are unintentional.

## 2017-05-31 ENCOUNTER — Ambulatory Visit (INDEPENDENT_AMBULATORY_CARE_PROVIDER_SITE_OTHER): Payer: Commercial Managed Care - PPO | Admitting: Family Medicine

## 2017-05-31 ENCOUNTER — Encounter: Payer: Self-pay | Admitting: Family Medicine

## 2017-05-31 VITALS — BP 116/82 | HR 84 | Ht 65.0 in | Wt 143.0 lb

## 2017-05-31 DIAGNOSIS — M999 Biomechanical lesion, unspecified: Secondary | ICD-10-CM

## 2017-05-31 DIAGNOSIS — M5412 Radiculopathy, cervical region: Secondary | ICD-10-CM | POA: Diagnosis not present

## 2017-05-31 NOTE — Assessment & Plan Note (Signed)
Patient is improving but slowly.  Continue the same medications at this time.  No side effects.

## 2017-05-31 NOTE — Assessment & Plan Note (Signed)
Decision today to treat with OMT was based on Physical Exam  After verbal consent patient was treated with HVLA, ME, FPR techniques in cervical, thoracic, lumbar and sacral areas  Patient tolerated the procedure well with improvement in symptoms  Patient given exercises, stretches and lifestyle modifications  See medications in patient instructions if given  Patient will follow up in 4-6 weeks 

## 2017-05-31 NOTE — Patient Instructions (Signed)
Good to see you  Leslie Hughes is your friend.  Stay active Keep it up  See me again in 4 weeks

## 2017-06-05 ENCOUNTER — Ambulatory Visit: Payer: Commercial Managed Care - PPO | Admitting: Physical Therapy

## 2017-06-24 ENCOUNTER — Other Ambulatory Visit: Payer: Self-pay | Admitting: Family Medicine

## 2017-06-28 ENCOUNTER — Encounter: Payer: Self-pay | Admitting: Family Medicine

## 2017-06-28 ENCOUNTER — Ambulatory Visit: Payer: Self-pay

## 2017-06-28 ENCOUNTER — Ambulatory Visit (INDEPENDENT_AMBULATORY_CARE_PROVIDER_SITE_OTHER): Payer: Commercial Managed Care - PPO | Admitting: Family Medicine

## 2017-06-28 VITALS — BP 124/90 | HR 76 | Ht 65.0 in | Wt 143.0 lb

## 2017-06-28 DIAGNOSIS — M898X6 Other specified disorders of bone, lower leg: Secondary | ICD-10-CM

## 2017-06-28 DIAGNOSIS — M5412 Radiculopathy, cervical region: Secondary | ICD-10-CM | POA: Diagnosis not present

## 2017-06-28 DIAGNOSIS — M25572 Pain in left ankle and joints of left foot: Secondary | ICD-10-CM

## 2017-06-28 NOTE — Progress Notes (Signed)
[name] [place], [place] [address] Phone: [phone] Subjective:    CC: Neck pain follow-up  [id number]  Leslie Hughes is a 42 y.o. female coming in with complaint of neck pain follow-up.  Was found to have more of a cervical radiculopathy.  Patient was doing very well with osteopathic manipulation as well as formal physical therapy.  Patient's weakness was improving.  The Effexor was started.  Patient states feeling approximately the same.  Patient states that she is not having any side effects of medication and is sleeping actually more comfortably.  More left ankle pain.  States that it is moderate to severe.  States it does not seem to go away.  Patient states that it hurts more with activity though.  Patient has not been playing tennis this regularly because she has been out of town.  Patient states that it has been uncomfortable overall.  Seems to be on the anterior aspect of the ankle.    X-rays of the neck showed no significant osteoarthritic changes.  These were independently visualized by me and taken May 01, 2017  Past Medical History:  Diagnosis Date  . Allergy   . Cervical dystonia    sees Dr. Tonye Royalty at [facility]   . Degenerative arthritis of right knee 09/10/2015   Started viscous supplementation impression 22nd 2017  . Metrorrhagia    sees Dr. Marylynn Pearson   . Migraine   . Vitamin D deficiency    Past Surgical History:  Procedure Laterality Date  . CESAREAN SECTION  2009  . [place] and 2008   arthroscopy to right knee (tennis injuries)   Social History   Socioeconomic History  . Marital status: Married    Spouse name: Not on file  . Number of children: Not on file  . Years of education: Not on file  . Highest education level: Not on file  Occupational History  . Not on file  Social Needs  . Financial resource strain: Not on file  . Food insecurity:    Worry: Not on file   Inability: Not on file  . Transportation needs:    Medical: Not on file    Non-medical: Not on file  Tobacco Use  . Smoking status: Never Smoker  . Smokeless tobacco: Never Used  Substance and Sexual Activity  . Alcohol use: Yes    Alcohol/week: 0.0 oz    Comment: occ  . Drug use: No  . Sexual activity: Not on file  Lifestyle  . Physical activity:    Days per week: Not on file    Minutes per session: Not on file  . Stress: Not on file  Relationships  . Social connections:    Talks on phone: Not on file    Gets together: Not on file    Attends religious service: Not on file    Active member of club or organization: Not on file    Attends meetings of clubs or organizations: Not on file    Relationship status: Not on file  Other Topics Concern  . Not on file  Social History Narrative  . Not on file   Allergies  Allergen Reactions  . Amoxicillin    Family History  Problem Relation Age of Onset  . Hypothyroidism Mother   . Thyroid disease Mother   . Cancer Father        bladder  . Hyperlipidemia Father   . Hypertension Father   .  Skin cancer Paternal Grandmother   . Colon cancer Paternal Grandfather      Past medical history, social, surgical and family history all reviewed in electronic medical record.  No pertanent information unless stated regarding to the chief complaint.   Review of Systems:Review of systems updated and as accurate as of 06/28/17  No headache, visual changes, nausea, vomiting, diarrhea, constipation, dizziness, abdominal pain, skin rash, fevers, chills, night sweats, weight loss, swollen lymph nodes, body aches, joint swelling, muscle aches, chest pain, shortness of breath, mood changes.   Objective  Blood pressure 124/90, pulse 76, height 5\' 5"  (1.651 m), weight 143 lb (64.9 kg), SpO2 97 %. Systems examined below as of 06/28/17   General: No apparent distress alert and oriented x3 mood and affect normal, dressed appropriately.  HEENT: Pupils  equal, extraocular movements intact  Respiratory: Patient's speak in full sentences and does not appear short of breath  Cardiovascular: No lower extremity edema, non tender, no erythema  Skin: Warm dry intact with no signs of infection or rash on extremities or on axial skeleton.  Abdomen: Soft nontender  Neuro: Cranial nerves II through XII are intact, neurovascularly intact in all extremities with 2+ DTRs and 2+ pulses.  Lymph: No lymphadenopathy of posterior or anterior cervical chain or axillae bilaterally.  Gait normal with good balance and coordination.  MSK:  Non tender with full range of motion and good stability and symmetric strength and tone of shoulders, elbows, wrist, hip, knee and bilaterally.  Neck: Inspection loss of lordosis. No palpable stepoffs. Negative Spurling's maneuver. Full neck range of motion Strength is 4+ out of 5 on the right side which is an improvement. Negative Hoffman sign bilaterally Reflexes normal  Left ankle exam shows patient still has some very trace swelling of the distal tibial region more anterior.  Still has very pinpoint tenderness over the syndesmosis.  Positive pain with external rotation of the leg.  Ankle though otherwise has full range of motion.  Negative Tinel's.  Full strength noted.  Procedure: Real-time Ultrasound Guided Injection of left syndesmosis of the ankle Device: GE Logiq Q7 Ultrasound guided injection is preferred based studies that show increased duration, increased effect, greater accuracy, decreased procedural pain, increased response rate, and decreased cost with ultrasound guided versus blind injection.  Verbal informed consent obtained.  Time-out conducted.  Noted no overlying erythema, induration, or other signs of local infection.  Skin prepped in a sterile fashion.  Local anesthesia: Topical Ethyl chloride.  With sterile technique and under real time ultrasound guidance: With a 25-gauge 1 inch needle patient was  injected with 0.5 cc of 0.5% Marcaine and 0.5 cc of Kenalog 40 mg/mL Completed without difficulty  Pain immediately resolved suggesting accurate placement of the medication.  Advised to call if fevers/chills, erythema, induration, drainage, or persistent bleeding.  Images permanently stored and available for review in the ultrasound unit.  Impression: Technically successful ultrasound guided injection.   Impression and Recommendations:     This case required medical decision making of moderate complexity.      Note: This dictation was prepared with Dragon dictation along with smaller phrase technology. Any transcriptional errors that result from this process are unintentional.

## 2017-06-28 NOTE — Patient Instructions (Signed)
Good to see you  Tried a syndesmosis injection off the ankle today I hope it helps Compression still a good idea Take it easy a couple weeks.  Increase effexor to 2 pills daily and write me in 7-10 days and tell me how it feels Lets keep watching the neck and if worsens then need MRI See me again in 4-5 weeks otherwise

## 2017-06-28 NOTE — Assessment & Plan Note (Signed)
Still concerning.  Mild weakness still noted.  Patient though has gotten somewhat better.  Encourage her to do the exercises regularly.  Patient will do a trial of an increase Effexor.  Discussed muscle relaxer at night when needed.  Follow-up with me again in 4 weeks

## 2017-06-28 NOTE — Assessment & Plan Note (Signed)
Attempted syndesmosis injection.  X-rays greater than 2 years ago did not show any bony abnormality.  We will continue to monitor closely.  Follow-up again 4 weeks

## 2017-07-11 DIAGNOSIS — D239 Other benign neoplasm of skin, unspecified: Secondary | ICD-10-CM | POA: Diagnosis not present

## 2017-07-11 DIAGNOSIS — L700 Acne vulgaris: Secondary | ICD-10-CM | POA: Diagnosis not present

## 2017-07-11 DIAGNOSIS — L300 Nummular dermatitis: Secondary | ICD-10-CM | POA: Diagnosis not present

## 2017-07-15 ENCOUNTER — Encounter: Payer: Self-pay | Admitting: Family Medicine

## 2017-07-17 MED ORDER — VENLAFAXINE HCL ER 75 MG PO CP24: 75.0000 mg | ORAL_CAPSULE | Freq: Every day | ORAL | 1 refills | Status: DC

## 2017-07-21 ENCOUNTER — Other Ambulatory Visit: Payer: Self-pay | Admitting: Family Medicine

## 2017-07-21 DIAGNOSIS — Z01419 Encounter for gynecological examination (general) (routine) without abnormal findings: Secondary | ICD-10-CM | POA: Diagnosis not present

## 2017-07-21 DIAGNOSIS — Z1321 Encounter for screening for nutritional disorder: Secondary | ICD-10-CM | POA: Diagnosis not present

## 2017-07-21 DIAGNOSIS — Z8052 Family history of malignant neoplasm of bladder: Secondary | ICD-10-CM | POA: Diagnosis not present

## 2017-07-21 DIAGNOSIS — Z13220 Encounter for screening for lipoid disorders: Secondary | ICD-10-CM | POA: Diagnosis not present

## 2017-07-21 DIAGNOSIS — Z8042 Family history of malignant neoplasm of prostate: Secondary | ICD-10-CM | POA: Diagnosis not present

## 2017-07-21 DIAGNOSIS — Z6822 Body mass index (BMI) 22.0-22.9, adult: Secondary | ICD-10-CM | POA: Diagnosis not present

## 2017-07-21 DIAGNOSIS — Z1329 Encounter for screening for other suspected endocrine disorder: Secondary | ICD-10-CM | POA: Diagnosis not present

## 2017-07-21 DIAGNOSIS — Z808 Family history of malignant neoplasm of other organs or systems: Secondary | ICD-10-CM | POA: Diagnosis not present

## 2017-07-24 NOTE — Telephone Encounter (Signed)
Last OV 04/17/2017   Last refilled  07/19/2016 disp 10 with 11 refills   Sent to PCP for approval

## 2017-08-01 NOTE — Progress Notes (Signed)
[facility] [place], [place] [address] Phone: [phone] Subjective:    CC: Back pain follow-up  TOI:[id number]  Leslie Hughes is a 42 y.o. female coming in with complaint of back pain, neck pain and left ankle follow-up. Patient regarding her ankle started having worsening pain again.  Was given an injection and had some mild benefit for days and then seemed to be worsening it.  Not doing as much activity secondary to the discomfort.  Patient also states that her neck and back is worsening over the course of time.       Past Medical History:  Diagnosis Date  . Allergy   . Cervical dystonia    sees Dr. Tonye Royalty at [facility]   . Degenerative arthritis of right knee 09/10/2015   Started viscous supplementation impression 22nd 2017  . Metrorrhagia    sees Dr. Marylynn Pearson   . Migraine   . Vitamin D deficiency    Past Surgical History:  Procedure Laterality Date  . CESAREAN SECTION  2009  . [place] and 2008   arthroscopy to right knee (tennis injuries)   Social History   Socioeconomic History  . Marital status: Married    Spouse name: Not on file  . Number of children: Not on file  . Years of education: Not on file  . Highest education level: Not on file  Occupational History  . Not on file  Social Needs  . Financial resource strain: Not on file  . Food insecurity:    Worry: Not on file    Inability: Not on file  . Transportation needs:    Medical: Not on file    Non-medical: Not on file  Tobacco Use  . Smoking status: Never Smoker  . Smokeless tobacco: Never Used  Substance and Sexual Activity  . Alcohol use: Yes    Alcohol/week: 0.0 oz    Comment: occ  . Drug use: No  . Sexual activity: Not on file  Lifestyle  . Physical activity:    Days per week: Not on file    Minutes per session: Not on file  . Stress: Not on file  Relationships  . Social connections:    Talks on phone: Not on file   Gets together: Not on file    Attends religious service: Not on file    Active member of club or organization: Not on file    Attends meetings of clubs or organizations: Not on file    Relationship status: Not on file  Other Topics Concern  . Not on file  Social History Narrative  . Not on file   Allergies  Allergen Reactions  . Amoxicillin    Family History  Problem Relation Age of Onset  . Hypothyroidism Mother   . Thyroid disease Mother   . Cancer Father        bladder  . Hyperlipidemia Father   . Hypertension Father   . Skin cancer Paternal Grandmother   . Colon cancer Paternal Grandfather      Past medical history, social, surgical and family history all reviewed in electronic medical record.  No pertanent information unless stated regarding to the chief complaint.   Review of Systems:Review of systems updated and as accurate as of 08/02/17  No headache, visual changes, nausea, vomiting, diarrhea, constipation, dizziness, abdominal pain, skin rash, fevers, chills, night sweats, weight loss, swollen lymph nodes,  joint swelling, chest pain, shortness of breath, mood  changes.  Positive muscle aches, body aches  Objective  Blood pressure 120/80, pulse 89, height 5\' 5"  (1.651 m), weight 137 lb (62.1 kg), SpO2 98 %. Systems examined below as of 08/02/17   General: No apparent distress alert and oriented x3 mood and affect normal, dressed appropriately.  HEENT: Pupils equal, extraocular movements intact  Respiratory: Patient's speak in full sentences and does not appear short of breath  Cardiovascular: No lower extremity edema, non tender, no erythema  Skin: Warm dry intact with no signs of infection or rash on extremities or on axial skeleton.  Abdomen: Soft nontender  Neuro: Cranial nerves II through XII are intact, neurovascularly intact in all extremities with 2+ DTRs and 2+ pulses.  Lymph: No lymphadenopathy of posterior or anterior cervical chain or axillae  bilaterally.  Gait normal with good balance and coordination.  MSK:  tender with full range of motion and good stability and symmetric strength and tone of shoulders, elbows, wrist, hip, knee and ankles bilaterally.  Patient diffusely has discomfort in the paraspinal musculature from cervical through thoracic spine.  Patient also has some pain over the sacroiliac joint bilaterally.  Left ankle exam still shows some very mild swelling over the anterior tibialis tendon in the anterior aspect of the ankle mortise compared to contralateral side.  Patient does not have any significant pain to palpation.  Negative compression test.  Patient is able to move the ankle in all planes.  Osteopathic findings C6 flexed rotated and side bent left T3 extended rotated and side bent right inhaled third rib T9 extended rotated and side bent left L1 flexed rotated and side bent right Sacrum right on right     Impression and Recommendations:     This case required medical decision making of moderate complexity.      Note: This dictation was prepared with Dragon dictation along with smaller phrase technology. Any transcriptional errors that result from this process are unintentional.

## 2017-08-02 ENCOUNTER — Ambulatory Visit (INDEPENDENT_AMBULATORY_CARE_PROVIDER_SITE_OTHER): Payer: Commercial Managed Care - PPO | Admitting: Family Medicine

## 2017-08-02 ENCOUNTER — Encounter: Payer: Self-pay | Admitting: Family Medicine

## 2017-08-02 ENCOUNTER — Other Ambulatory Visit (INDEPENDENT_AMBULATORY_CARE_PROVIDER_SITE_OTHER): Payer: Commercial Managed Care - PPO

## 2017-08-02 VITALS — BP 120/80 | HR 89 | Ht 65.0 in | Wt 137.0 lb

## 2017-08-02 DIAGNOSIS — M2550 Pain in unspecified joint: Secondary | ICD-10-CM

## 2017-08-02 DIAGNOSIS — M999 Biomechanical lesion, unspecified: Secondary | ICD-10-CM | POA: Diagnosis not present

## 2017-08-02 DIAGNOSIS — G243 Spasmodic torticollis: Secondary | ICD-10-CM | POA: Diagnosis not present

## 2017-08-02 LAB — T4, FREE: FREE T4: 0.76 ng/dL (ref 0.60–1.60)

## 2017-08-02 LAB — IBC PANEL
Iron: 204 ug/dL — ABNORMAL HIGH (ref 42–145)
SATURATION RATIOS: 47.9 % (ref 20.0–50.0)
Transferrin: 304 mg/dL (ref 212.0–360.0)

## 2017-08-02 LAB — COMPREHENSIVE METABOLIC PANEL
ALBUMIN: 4.7 g/dL (ref 3.5–5.2)
ALT: 20 U/L (ref 0–35)
AST: 18 U/L (ref 0–37)
Alkaline Phosphatase: 46 U/L (ref 39–117)
BUN: 11 mg/dL (ref 6–23)
CHLORIDE: 105 meq/L (ref 96–112)
CO2: 24 meq/L (ref 19–32)
CREATININE: 0.83 mg/dL (ref 0.40–1.20)
Calcium: 9.7 mg/dL (ref 8.4–10.5)
GFR: 81.16 mL/min (ref >60.00)
Glucose, Bld: 67 mg/dL — ABNORMAL LOW (ref 70–99)
POTASSIUM: 3.6 meq/L (ref 3.5–5.1)
Sodium: 140 mEq/L (ref 135–145)
Total Bilirubin: 0.5 mg/dL (ref 0.2–1.2)
Total Protein: 7.7 g/dL (ref 6.0–8.3)

## 2017-08-02 LAB — CBC WITH DIFFERENTIAL/PLATELET
Basophils Absolute: 0 10*3/uL (ref 0.0–0.1)
Basophils Relative: 0.7 % (ref 0.0–3.0)
EOS ABS: 0 10*3/uL (ref 0.0–0.7)
Eosinophils Relative: 0.5 % (ref 0.0–5.0)
HCT: 41.9 % (ref 36.0–46.0)
HEMOGLOBIN: 14.3 g/dL (ref 12.0–15.0)
Lymphocytes Relative: 25.2 % (ref 12.0–46.0)
Lymphs Abs: 1.7 10*3/uL (ref 0.7–4.0)
MCHC: 34 g/dL (ref 30.0–36.0)
MCV: 90.4 fl (ref 78.0–100.0)
MONO ABS: 0.4 10*3/uL (ref 0.1–1.0)
Monocytes Relative: 6.3 % (ref 3.0–12.0)
Neutro Abs: 4.4 10*3/uL (ref 1.4–7.7)
Neutrophils Relative %: 67.3 % (ref 43.0–77.0)
PLATELETS: 253 10*3/uL (ref 150.0–400.0)
RBC: 4.64 Mil/uL (ref 3.87–5.11)
RDW: 13.1 % (ref 11.5–15.5)
WBC: 6.6 10*3/uL (ref 4.0–10.5)

## 2017-08-02 LAB — URIC ACID: URIC ACID, SERUM: 4.5 mg/dL (ref 2.4–7.0)

## 2017-08-02 LAB — T3, FREE: T3 FREE: 3.5 pg/mL (ref 2.3–4.2)

## 2017-08-02 LAB — C-REACTIVE PROTEIN: CRP: 0.3 mg/dL — AB (ref 0.5–20.0)

## 2017-08-02 LAB — SEDIMENTATION RATE: SED RATE: 6 mm/h (ref 0–20)

## 2017-08-02 LAB — TSH: TSH: 1.39 u[IU]/mL (ref 0.35–4.50)

## 2017-08-02 NOTE — Assessment & Plan Note (Addendum)
Decision today to treat with OMT was based on Physical Exam  After verbal consent patient was treated with HVLA, ME, FPR techniques in cervical, thoracic, rib, lumbar and sacral areas  Patient tolerated the procedure well with improvement in symptoms  Patient given exercises, stretches and lifestyle modifications  See medications in patient instructions if given  Patient will follow up in 2-4 weeks

## 2017-08-02 NOTE — Patient Instructions (Signed)
Good to see you  Leslie Hughes is your friend.  Stay active I am hoping the labs will give [place] some direction  Read about pyruvate just in case we want to consider this  See me again 1-2 weeks after your trip

## 2017-08-02 NOTE — Assessment & Plan Note (Signed)
Has had dystonia previously.  Has been worked up by multiple different people.  Has had multiple different injuries over the course of time and seems to be having worsening polyarthralgia.  Laboratory work-up ordered today for further evaluation.  Patient is not making as much progress that she has previously.  Does respond somewhat to manipulation.  We will see how patient responds to this.  Discussed icing regimen and home exercise.  Follow-up again with me 2 to 4 weeks

## 2017-08-03 ENCOUNTER — Other Ambulatory Visit: Payer: Self-pay | Admitting: Family Medicine

## 2017-08-04 LAB — RHEUMATOID FACTOR

## 2017-08-04 LAB — CALCIUM, IONIZED: CALCIUM ION: 5.44 mg/dL (ref 4.8–5.6)

## 2017-08-04 LAB — ANGIOTENSIN CONVERTING ENZYME: Angiotensin-Converting Enzyme: 31 U/L (ref 9–67)

## 2017-08-04 LAB — TESTOSTERONE, FREE, TOTAL, SHBG
Sex Hormone Binding: 186.5 nmol/L — ABNORMAL HIGH (ref 24.6–122.0)
Testosterone, Free: 3.2 pg/mL (ref 0.0–4.2)
Testosterone: 35 ng/dL (ref 8–48)

## 2017-08-04 LAB — ANTI-NUCLEAR AB-TITER (ANA TITER)

## 2017-08-04 LAB — PTH, INTACT AND CALCIUM
Calcium: 10 mg/dL (ref 8.6–10.2)
PTH: 16 pg/mL (ref 14–64)

## 2017-08-04 LAB — CYCLIC CITRUL PEPTIDE ANTIBODY, IGG: Cyclic Citrullin Peptide Ab: <16 UNITS

## 2017-08-04 LAB — ANA: Anti Nuclear Antibody(ANA): POSITIVE — AB

## 2017-08-04 LAB — VITAMIN D 1,25 DIHYDROXY
VITAMIN D 1, 25 (OH) TOTAL: 56 pg/mL (ref 18–72)
VITAMIN D2 1, 25 (OH): 27 pg/mL
VITAMIN D3 1, 25 (OH): 29 pg/mL

## 2017-08-07 ENCOUNTER — Encounter: Payer: Self-pay | Admitting: Family Medicine

## 2017-08-07 DIAGNOSIS — R768 Other specified abnormal immunological findings in serum: Secondary | ICD-10-CM

## 2017-08-08 ENCOUNTER — Encounter: Payer: Self-pay | Admitting: Family Medicine

## 2017-08-16 NOTE — Progress Notes (Signed)
[facility] [place], [place] [address] Phone: [phone] Subjective:     CC: Ankle pain follow-up, right knee pain follow-up  JAS:[id number]  Leslie Hughes is a 42 y.o. female coming in with complaint of right knee pain.  Has had this performed.  Does have known degenerative arthritis.  Has done Visco supplementation in the past.  States it is moderately tender.  Patient was able to do a lot of hiking now and thinks that has aggravated it somewhat.  Patient's left ankle was also found to have more of a syndesmosis injury.  Had responded fairly well to the conservative therapy.  Still has some discomfort but nothing severe.  No longer having any swelling.  Injection given 6 weeks ago  Patient was having increasing pain all over and was found to have a positive ANA.  Patient has noticed that she has had a rash on her face seeing a dermatologist as well.  Has been referred to rheumatology.    Past Medical History:  Diagnosis Date  . Allergy   . Cervical dystonia    sees Dr. Tonye Royalty at [facility]   . Degenerative arthritis of right knee 09/10/2015   Started viscous supplementation impression 22nd 2017  . Metrorrhagia    sees Dr. Marylynn Pearson   . Migraine   . Vitamin D deficiency    Past Surgical History:  Procedure Laterality Date  . CESAREAN SECTION  2009  . [place] and 2008   arthroscopy to right knee (tennis injuries)   Social History   Socioeconomic History  . Marital status: Married    Spouse name: Not on file  . Number of children: Not on file  . Years of education: Not on file  . Highest education level: Not on file  Occupational History  . Not on file  Social Needs  . Financial resource strain: Not on file  . Food insecurity:    Worry: Not on file    Inability: Not on file  . Transportation needs:    Medical: Not on file    Non-medical: Not on file  Tobacco Use  . Smoking status: Never Smoker  .  Smokeless tobacco: Never Used  Substance and Sexual Activity  . Alcohol use: Yes    Alcohol/week: 0.0 oz    Comment: occ  . Drug use: No  . Sexual activity: Not on file  Lifestyle  . Physical activity:    Days per week: Not on file    Minutes per session: Not on file  . Stress: Not on file  Relationships  . Social connections:    Talks on phone: Not on file    Gets together: Not on file    Attends religious service: Not on file    Active member of club or organization: Not on file    Attends meetings of clubs or organizations: Not on file    Relationship status: Not on file  Other Topics Concern  . Not on file  Social History Narrative  . Not on file   Allergies  Allergen Reactions  . Amoxicillin    Family History  Problem Relation Age of Onset  . Hypothyroidism Mother   . Thyroid disease Mother   . Cancer Father        bladder  . Hyperlipidemia Father   . Hypertension Father   . Skin cancer Paternal Grandmother   . Colon cancer Paternal Grandfather  Past medical history, social, surgical and family history all reviewed in electronic medical record.  No pertanent information unless stated regarding to the chief complaint.   Review of Systems:Review of systems updated and as accurate as of 08/17/17  No headache, visual changes, nausea, vomiting, diarrhea, constipation, dizziness, abdominal pain, skin rash, fevers, chills, night sweats, weight loss, swollen lymph nodes, body aches, joint swelling, muscle aches, chest pain, shortness of breath, mood changes.   Objective  Blood pressure 100/82, pulse 78, height 5\' 5"  (1.651 m), weight 138 lb (62.6 kg), SpO2 98 %. Systems examined below as of 08/17/17   General: No apparent distress alert and oriented x3 mood and affect normal, dressed appropriately.  HEENT: Pupils equal, extraocular movements intact  Respiratory: Patient's speak in full sentences and does not appear short of breath  Cardiovascular: No lower  extremity edema, non tender, no erythema  Skin: Warm dry intact with no signs of infection or rash on extremities or on axial skeleton.  Abdomen: Soft nontender  Neuro: Cranial nerves II through XII are intact, neurovascularly intact in all extremities with 2+ DTRs and 2+ pulses.  Lymph: No lymphadenopathy of posterior or anterior cervical chain or axillae bilaterally.  Gait normal with good balance and coordination.  MSK:  Non tender with full range of motion and good stability and symmetric strength and tone of shoulders, elbows, wrist, hip, bilaterally.  Right knee exam shows the patient does have some tenderness over the medial and patellofemoral joint.  Mild crepitus noted but full range of motion.  No increasing instability.  Positive patellar grind Contralateral knee unremarkable  Ankle: Left No visible erythema or swelling. Range of motion is full in all directions. Strength is 5/5 in all directions. Stable lateral and medial ligaments; squeeze test and kleiger test unremarkable; Talar dome nontender; No pain at base of 5th MT; No tenderness over cuboid; No tenderness over N spot or navicular prominence No tenderness on posterior aspects of lateral and medial malleolus No sign of peroneal tendon subluxations or tenderness to palpation Negative tarsal tunnel tinel's Able to walk 4 steps. Contralateral ankle unremarkable  Back exam some some tightness of the right sacroiliac joint.  Mild positive Corky Sox.  Negative straight leg test neck exam shows full range of motion and negative Spurling's today.  Some improvement but still tenderness to palpation at the cervical thoracic juncture  Osteopathic findings C2 flexed rotated and side bent right C4 flexed rotated and side bent left T3 extended rotated and side bent right inhaled third rib T6 extended rotated and side bent left L3 flexed rotated and side bent right Sacrum right on right    Impression and Recommendations:     This  case required medical decision making of moderate complexity.      Note: This dictation was prepared with Dragon dictation along with smaller phrase technology. Any transcriptional errors that result from this process are unintentional.

## 2017-08-17 ENCOUNTER — Ambulatory Visit: Payer: Commercial Managed Care - PPO | Admitting: Family Medicine

## 2017-08-17 ENCOUNTER — Encounter: Payer: Self-pay | Admitting: Family Medicine

## 2017-08-17 VITALS — BP 100/82 | HR 78 | Ht 65.0 in | Wt 138.0 lb

## 2017-08-17 DIAGNOSIS — M5412 Radiculopathy, cervical region: Secondary | ICD-10-CM | POA: Diagnosis not present

## 2017-08-17 DIAGNOSIS — M999 Biomechanical lesion, unspecified: Secondary | ICD-10-CM | POA: Diagnosis not present

## 2017-08-17 DIAGNOSIS — M1711 Unilateral primary osteoarthritis, right knee: Secondary | ICD-10-CM | POA: Diagnosis not present

## 2017-08-17 NOTE — Patient Instructions (Signed)
Good to see you  Ice is yoru friend Stay active Lets see what rheumatology says See me again in 4 weeks or right before your trip

## 2017-08-17 NOTE — Assessment & Plan Note (Signed)
Stable.  Discussed icing regimen and home exercise.  Discussed which activities to do which wants to avoid.  Patient is to increase activity as tolerated.  Follow-up again 4 weeks.  Can repeat injections if needed

## 2017-08-17 NOTE — Assessment & Plan Note (Signed)
Stable with no significant radicular symptoms or weakness noted at this time.

## 2017-08-17 NOTE — Assessment & Plan Note (Signed)
Decision today to treat with OMT was based on Physical Exam  After verbal consent patient was treated with HVLA, ME, FPR techniques in cervical, thoracic, rib lumbar and sacral areas  Patient tolerated the procedure well with improvement in symptoms  Patient given exercises, stretches and lifestyle modifications  See medications in patient instructions if given  Patient will follow up in 4 weeks 

## 2017-08-23 ENCOUNTER — Ambulatory Visit (INDEPENDENT_AMBULATORY_CARE_PROVIDER_SITE_OTHER): Payer: Commercial Managed Care - PPO | Admitting: Family Medicine

## 2017-08-23 ENCOUNTER — Encounter: Payer: Self-pay | Admitting: Family Medicine

## 2017-08-23 DIAGNOSIS — M1711 Unilateral primary osteoarthritis, right knee: Secondary | ICD-10-CM

## 2017-08-23 NOTE — Patient Instructions (Signed)
injected the knee.  Will get approval just in case for the other injections Have a good trip  See me as scheduled.

## 2017-08-23 NOTE — Progress Notes (Signed)
[facility] [place], [place] [address] Phone: [phone] Subjective:     CC: Right knee pain  [id number]  Leslie Hughes is a 42 y.o. female coming in with complaint of right knee pain.  The patient has known chondromalacia of the knee.  Responded previously and greater than a year ago with Visco supplementation.  Started having worsening symptoms again.  Going to be traveling and states that this moment the pain in the knee has stopped her from activity.  This is very rare for that to happen.  May be some increasing swelling.  Waking her up at night.     Past Medical History:  Diagnosis Date  . Allergy   . Cervical dystonia    sees Dr. Tonye Royalty at [facility]   . Degenerative arthritis of right knee 09/10/2015   Started viscous supplementation impression 22nd 2017  . Metrorrhagia    sees Dr. Marylynn Pearson   . Migraine   . Vitamin D deficiency    Past Surgical History:  Procedure Laterality Date  . CESAREAN SECTION  2009  . [place] and 2008   arthroscopy to right knee (tennis injuries)   Social History   Socioeconomic History  . Marital status: Married    Spouse name: Not on file  . Number of children: Not on file  . Years of education: Not on file  . Highest education level: Not on file  Occupational History  . Not on file  Social Needs  . Financial resource strain: Not on file  . Food insecurity:    Worry: Not on file    Inability: Not on file  . Transportation needs:    Medical: Not on file    Non-medical: Not on file  Tobacco Use  . Smoking status: Never Smoker  . Smokeless tobacco: Never Used  Substance and Sexual Activity  . Alcohol use: Yes    Alcohol/week: 0.0 oz    Comment: occ  . Drug use: No  . Sexual activity: Not on file  Lifestyle  . Physical activity:    Days per week: Not on file    Minutes per session: Not on file  . Stress: Not on file  Relationships  . Social connections:    Talks on phone: Not on file    Gets together: Not on file    Attends religious service: Not on file    Active member of club or organization: Not on file    Attends meetings of clubs or organizations: Not on file    Relationship status: Not on file  Other Topics Concern  . Not on file  Social History Narrative  . Not on file   Allergies  Allergen Reactions  . Amoxicillin    Family History  Problem Relation Age of Onset  . Hypothyroidism Mother   . Thyroid disease Mother   . Cancer Father        bladder  . Hyperlipidemia Father   . Hypertension Father   . Skin cancer Paternal Grandmother   . Colon cancer Paternal Grandfather      Past medical history, social, surgical and family history all reviewed in electronic medical record.  No pertanent information unless stated regarding to the chief complaint.   Review of Systems:Review of systems updated and as accurate as of 08/23/17  No headache, visual changes, nausea, vomiting, diarrhea, constipation, dizziness, abdominal pain, skin rash, fevers, chills, night sweats, weight loss, swollen lymph nodes,  body aches, joint swelling,  chest pain, shortness of breath, mood changes.  Positive muscle aches  Objective  Blood pressure 110/80, pulse 83, height 5\' 5"  (1.651 m), weight 135 lb (61.2 kg), SpO2 98 %. Systems examined below as of 08/23/17   General: No apparent distress alert and oriented x3 mood and affect normal, dressed appropriately.  HEENT: Pupils equal, extraocular movements intact  Respiratory: Patient's speak in full sentences and does not appear short of breath  Cardiovascular: No lower extremity edema, non tender, no erythema  Skin: Warm dry intact with no signs of infection or rash on extremities or on axial skeleton.  Abdomen: Soft nontender  Neuro: Cranial nerves II through XII are intact, neurovascularly intact in all extremities with 2+ DTRs and 2+ pulses.  Lymph: No lymphadenopathy of posterior or anterior  cervical chain or axillae bilaterally.  Gait normal with good balance and coordination.  MSK:  Non tender with full range of motion and good stability and symmetric strength and tone of shoulders, elbows, wrist, hip, and ankles bilaterally.  Right knee does have some tenderness to palpation over the lateral joint line.  Patient still has some crepitus noted.  No instability at the moment.  Near full range of motion lacking last 2 degrees of extension contralateral knee unremarkable  After informed written and verbal consent, patient was seated on exam table. Right knee was prepped with alcohol swab and utilizing anterolateral approach, patient's right knee space was injected with 4:1  marcaine 0.5%: Kenalog 40mg /dL. Patient tolerated the procedure well without immediate complications.     Impression and Recommendations:     This case required medical decision making of moderate complexity.      Note: This dictation was prepared with Dragon dictation along with smaller phrase technology. Any transcriptional errors that result from this process are unintentional.

## 2017-08-23 NOTE — Assessment & Plan Note (Signed)
Patient given injection today.  Tolerated the procedure well.  Could be a candidate for Visco supplementation again.  Discussed icing regimen.  Patient will do topical anti-inflammatories.  Follow-up with me again in 4 weeks

## 2017-08-29 DIAGNOSIS — M25472 Effusion, left ankle: Secondary | ICD-10-CM | POA: Diagnosis not present

## 2017-08-29 DIAGNOSIS — G8929 Other chronic pain: Secondary | ICD-10-CM | POA: Diagnosis not present

## 2017-08-29 DIAGNOSIS — R768 Other specified abnormal immunological findings in serum: Secondary | ICD-10-CM | POA: Diagnosis not present

## 2017-08-29 DIAGNOSIS — M542 Cervicalgia: Secondary | ICD-10-CM | POA: Diagnosis not present

## 2017-09-16 NOTE — Progress Notes (Signed)
[name] [place], [place] [address] Phone: [phone] Subjective:    I'm seeing this patient by the request  of:    CC: Right knee pain follow-up  [id number]  Leslie Hughes is a 42 y.o. female coming in with complaint of right knee pain.  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  Was given an injection that was somewhat better but is unfortunately having worsening pain again.  Affecting daily activities.  Been approved for Visco supplementation.  Makes it difficult to stay active secondary to the discomfort and pain     Past Medical History:  Diagnosis Date  . Allergy   . Cervical dystonia    sees Dr. Tonye Royalty at [facility]   . Degenerative arthritis of right knee 09/10/2015   Started viscous supplementation impression 22nd 2017  . Metrorrhagia    sees Dr. Marylynn Pearson   . Migraine   . Vitamin D deficiency    Past Surgical History:  Procedure Laterality Date  . CESAREAN SECTION  2009  . [place] and 2008   arthroscopy to right knee (tennis injuries)   Social History   Socioeconomic History  . Marital status: Married    Spouse name: Not on file  . Number of children: Not on file  . Years of education: Not on file  . Highest education level: Not on file  Occupational History  . Not on file  Social Needs  . Financial resource strain: Not on file  . Food insecurity:    Worry: Not on file    Inability: Not on file  . Transportation needs:    Medical: Not on file    Non-medical: Not on file  Tobacco Use  . Smoking status: Never Smoker  . Smokeless tobacco: Never Used  Substance and Sexual Activity  . Alcohol use: Yes    Alcohol/week: 0.0 oz    Comment: occ  . Drug use: No  . Sexual activity: Not on file  Lifestyle  . Physical activity:    Days per week: Not on file    Minutes per session: Not on file  . Stress: Not on file  Relationships  . Social  connections:    Talks on phone: Not on file    Gets together: Not on file    Attends religious service: Not on file    Active member of club or organization: Not on file    Attends meetings of clubs or organizations: Not on file    Relationship status: Not on file  Other Topics Concern  . Not on file  Social History Narrative  . Not on file   Allergies  Allergen Reactions  . Amoxicillin    Family History  Problem Relation Age of Onset  . Hypothyroidism Mother   . Thyroid disease Mother   . Cancer Father        bladder  . Hyperlipidemia Father   . Hypertension Father   . Skin cancer Paternal Grandmother   . Colon cancer Paternal Grandfather      Past medical history, social, surgical and family history all reviewed in electronic medical record.  No pertanent information unless stated regarding to the chief complaint.   Review of Systems:Review of systems updated and as accurate as of 09/16/17  No headache, visual changes, nausea, vomiting, diarrhea, constipation, dizziness, abdominal pain, skin rash, fevers, chills, night sweats, weight loss, swollen lymph nodes, body aches, joint swelling, muscle  aches, chest pain, shortness of breath, mood changes.   Objective  There were no vitals taken for this visit. Systems examined below as of 09/16/17   General: No apparent distress alert and oriented x3 mood and affect normal, dressed appropriately.  HEENT: Pupils equal, extraocular movements intact  Respiratory: Patient's speak in full sentences and does not appear short of breath  Cardiovascular: No lower extremity edema, non tender, no erythema  Skin: Warm dry intact with no signs of infection or rash on extremities or on axial skeleton.  Abdomen: Soft nontender  Neuro: Cranial nerves II through XII are intact, neurovascularly intact in all extremities with 2+ DTRs and 2+ pulses.  Lymph: No lymphadenopathy of posterior or anterior cervical chain or axillae bilaterally.  Gait  normal with good balance and coordination.  MSK:  Non tender with full range of motion and good stability and symmetric strength and tone of shoulders, elbows, wrist, hip, and ankles bilaterally.  Knee: Right Mild valgus deformity noted.  Tender to palpation over medial and PF joint line.  ROM full in flexion and extension and lower leg rotation. instability with valgus force.  painful patellar compression. Patellar glide with moderate crepitus. Patellar and quadriceps tendons unremarkable. Hamstring and quadriceps strength is normal. Contralateral knee shows no significant arthritic changes  After informed written and verbal consent, patient was seated on exam table. Right knee was prepped with alcohol swab and utilizing anterolateral approach, patient's right knee space was injected with15 mg/2.5 mL of Orthovisc(sodium hyaluronate) in a prefilled syringe was injected easily into the knee through a 22-gauge needle..Patient tolerated the procedure well without immediate complications.   Impression and Recommendations:     This case required medical decision making of moderate complexity.      Note: This dictation was prepared with Dragon dictation along with smaller phrase technology. Any transcriptional errors that result from this process are unintentional.

## 2017-09-18 ENCOUNTER — Ambulatory Visit (INDEPENDENT_AMBULATORY_CARE_PROVIDER_SITE_OTHER): Payer: Commercial Managed Care - PPO | Admitting: Family Medicine

## 2017-09-18 ENCOUNTER — Telehealth: Payer: Self-pay | Admitting: Family Medicine

## 2017-09-18 ENCOUNTER — Encounter: Payer: Self-pay | Admitting: Family Medicine

## 2017-09-18 VITALS — BP 120/90 | HR 74 | Ht 65.0 in | Wt 134.0 lb

## 2017-09-18 DIAGNOSIS — M1711 Unilateral primary osteoarthritis, right knee: Secondary | ICD-10-CM | POA: Diagnosis not present

## 2017-09-18 DIAGNOSIS — M5412 Radiculopathy, cervical region: Secondary | ICD-10-CM | POA: Diagnosis not present

## 2017-09-18 DIAGNOSIS — M542 Cervicalgia: Secondary | ICD-10-CM

## 2017-09-18 NOTE — Assessment & Plan Note (Signed)
Not evaluated today.  Discussed with patient in great length.  Has advanced imaging.  We will get epidural.  See how patient responds

## 2017-09-18 NOTE — Telephone Encounter (Signed)
Sent to PCP to advise orders are needed for MRI or CT

## 2017-09-18 NOTE — Telephone Encounter (Signed)
Copied from [place] [id number]. Topic: General - Other >> Sep 18, 2017 11:52 AM Mylinda Latina, NT wrote: Reason for CRM:  Angelita Ingles called from [facility] imaging  and states  that an order was put in for a cervical injection. She states that the patient needs either an MRI or CT before this injection can be done. Patient is aware and will be calling the office. She wanted to let the provider know.

## 2017-09-18 NOTE — Patient Instructions (Addendum)
Good to see you  Started orthovisc  We order epidural C7-T1 as well call [phone] and you can schedule.  We will see you again in 2 weeks

## 2017-09-18 NOTE — Assessment & Plan Note (Signed)
Failed all conservative therapy.  Discussed icing regimen and home exercises.  Patient will follow-up in 2 weeks for second in a series of 4 injections for Visco supplementation

## 2017-09-19 NOTE — Telephone Encounter (Signed)
Since Dr. Tamala Julian is the one who ordered the injection, he should be the one to order a scan

## 2017-09-19 NOTE — Telephone Encounter (Signed)
Called and spoke with pt. Pt stated that this has already been addressed by Dr. Tamala Julian. Ok to close out message.

## 2017-09-27 ENCOUNTER — Ambulatory Visit
Admission: RE | Admit: 2017-09-27 | Discharge: 2017-09-27 | Disposition: A | Discharge status: Home / Self Care | Payer: Commercial Managed Care - PPO | Source: Ambulatory Visit | Attending: Family Medicine | Admitting: Family Medicine

## 2017-09-27 DIAGNOSIS — M542 Cervicalgia: Secondary | ICD-10-CM

## 2017-10-01 NOTE — Progress Notes (Signed)
[name] [address], [place] [address] Phone: [phone] Subjective:      CC: Knee pain follow-up  [id number]  Leslie Hughes is a 42 y.o. female coming in with complaint of right knee pain.  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  Responded very well to the first injection for Visco supplementation.  States that she is having almost no pain.  Was able to run 17 miles with no significant discomfort.  Happy with the results.  Has been biking as well.    Past Medical History:  Diagnosis Date  . Allergy   . Cervical dystonia    sees Dr. Tonye Royalty at [facility]   . Degenerative arthritis of right knee 09/10/2015   Started viscous supplementation impression 22nd 2017  . Metrorrhagia    sees Dr. Marylynn Pearson   . Migraine   . Vitamin D deficiency    Past Surgical History:  Procedure Laterality Date  . CESAREAN SECTION  2009  . [place] and 2008   arthroscopy to right knee (tennis injuries)   Social History   Socioeconomic History  . Marital status: Married    Spouse name: Not on file  . Number of children: Not on file  . Years of education: Not on file  . Highest education level: Not on file  Occupational History  . Not on file  Social Needs  . Financial resource strain: Not on file  . Food insecurity:    Worry: Not on file    Inability: Not on file  . Transportation needs:    Medical: Not on file    Non-medical: Not on file  Tobacco Use  . Smoking status: Never Smoker  . Smokeless tobacco: Never Used  Substance and Sexual Activity  . Alcohol use: Yes    Alcohol/week: 0.0 oz    Comment: occ  . Drug use: No  . Sexual activity: Not on file  Lifestyle  . Physical activity:    Days per week: Not on file    Minutes per session: Not on file  . Stress: Not on file  Relationships  . Social connections:    Talks on phone: Not on file    Gets together: Not on file    Attends religious  service: Not on file    Active member of club or organization: Not on file    Attends meetings of clubs or organizations: Not on file    Relationship status: Not on file  Other Topics Concern  . Not on file  Social History Narrative  . Not on file   Allergies  Allergen Reactions  . Amoxicillin    Family History  Problem Relation Age of Onset  . Hypothyroidism Mother   . Thyroid disease Mother   . Cancer Father        bladder  . Hyperlipidemia Father   . Hypertension Father   . Skin cancer Paternal Grandmother   . Colon cancer Paternal Grandfather      Past medical history, social, surgical and family history all reviewed in electronic medical record.  No pertanent information unless stated regarding to the chief complaint.   Review of Systems:Review of systems updated and as accurate as of 10/01/17  No headache, visual changes, nausea, vomiting, diarrhea, constipation, dizziness, abdominal pain, skin rash, fevers, chills, night sweats, weight loss, swollen lymph nodes, body aches, joint swelling, muscle aches, chest pain, shortness of breath, mood changes.  Objective  There were no vitals taken for this visit. Systems examined below as of 10/01/17   General: No apparent distress alert and oriented x3 mood and affect normal, dressed appropriately.  HEENT: Pupils equal, extraocular movements intact  Respiratory: Patient's speak in full sentences and does not appear short of breath  Cardiovascular: No lower extremity edema, non tender, no erythema  Skin: Warm dry intact with no signs of infection or rash on extremities or on axial skeleton.  Abdomen: Soft nontender  Neuro: Cranial nerves II through XII are intact, neurovascularly intact in all extremities with 2+ DTRs and 2+ pulses.  Lymph: No lymphadenopathy of posterior or anterior cervical chain or axillae bilaterally.  Gait normal with good balance and coordination.  MSK:  Non tender with full range of motion and good  stability and symmetric strength and tone of shoulders, elbows, wrist, hip, and ankles bilaterally.  Right knee exam shows mild crepitus and stable patella grind positive.  Otherwise fairly unremarkable.  After informed written and verbal consent, patient was seated on exam table. Right knee was prepped with alcohol swab and utilizing anterolateral approach, patient's right knee space was injected with15 mg/2.5 mL of Orthovisc(sodium hyaluronate) in a prefilled syringe was injected easily into the knee through a 22-gauge needle..Patient tolerated the procedure well without immediate complications.   Impression and Recommendations:     This case required medical decision making of moderate complexity.      Note: This dictation was prepared with Dragon dictation along with smaller phrase technology. Any transcriptional errors that result from this process are unintentional.

## 2017-10-02 ENCOUNTER — Ambulatory Visit (INDEPENDENT_AMBULATORY_CARE_PROVIDER_SITE_OTHER): Payer: Commercial Managed Care - PPO | Admitting: Family Medicine

## 2017-10-02 ENCOUNTER — Encounter: Payer: Self-pay | Admitting: Family Medicine

## 2017-10-02 ENCOUNTER — Other Ambulatory Visit: Payer: Self-pay | Admitting: Family Medicine

## 2017-10-02 DIAGNOSIS — M1711 Unilateral primary osteoarthritis, right knee: Secondary | ICD-10-CM | POA: Diagnosis not present

## 2017-10-02 NOTE — Assessment & Plan Note (Addendum)
Orthovisc injection given again.  Discussed icing regimen and home exercises.  Discussed which activities to do which was to avoid.  Follow-up again in 1 week for the third injection.

## 2017-10-02 NOTE — Patient Instructions (Signed)
Good to see you  Leslie Hughes is your friend.  See you again next week

## 2017-10-02 NOTE — Telephone Encounter (Signed)
Last OV 09/18/2017   Last refilled 09/20/2016 disp 1 with 11 refills   Sent to PCP for approval

## 2017-10-03 ENCOUNTER — Other Ambulatory Visit: Payer: Self-pay

## 2017-10-03 ENCOUNTER — Telehealth: Payer: Self-pay | Admitting: Family Medicine

## 2017-10-03 ENCOUNTER — Other Ambulatory Visit: Payer: Self-pay | Admitting: Family Medicine

## 2017-10-03 MED ORDER — VITAMIN D (ERGOCALCIFEROL) 1.25 MG (50000 UNIT) PO CAPS: ORAL_CAPSULE | ORAL | 0 refills | Status: DC

## 2017-10-03 MED ORDER — VENLAFAXINE HCL ER 75 MG PO CP24: 75.0000 mg | ORAL_CAPSULE | Freq: Every day | ORAL | 1 refills | Status: DC

## 2017-10-03 NOTE — Telephone Encounter (Signed)
Refill of mobic  LRF 04/17/17  #90  3 refills   Maxalt LRF 08/04/17  #10 11 refills   Zanaflex LRF 05/30/17  #90  3 refills   LOV 10/02/17  Dr. Tamala Julian   [facility] [place], [place]

## 2017-10-03 NOTE — Telephone Encounter (Signed)
Sent to PCP to advise 

## 2017-10-03 NOTE — Telephone Encounter (Signed)
Copied from [place] [id number]. Topic: Quick Communication - See Telephone Encounter >> Oct 03, 2017 10:12 AM Conception Chancy, NT wrote: CRM for notification. See Telephone encounter for: 10/03/17.  [facility] is calling and states the patient is needing a refill on venlafaxine XR (EFFEXOR XR) 75 MG 24 hr capsule and Vitamin D, Ergocalciferol, (DRISDOL) 50000 units CAPS capsule. Please advise.   Reference # [id number]  [place], [address] [place] [address] Phone: [phone] Fax: [phone] Not a 24 hour pharmacy; exact hours not known.

## 2017-10-03 NOTE — Telephone Encounter (Signed)
Copied from [place] [id number]. Topic: Quick Communication - Rx Refill/Question >> Oct 03, 2017 10:05 AM Robina Ade, Helene Kelp D wrote: Medication: rizatriptan (MAXALT) 10 MG tablet,tiZANidine (ZANAFLEX) 2 MG tablet,meloxicam (MOBIC) 15 MG tablet  Has the patient contacted their pharmacy?Yes (Agent: If no, request that the patient contact the pharmacy for the refill.) (Agent: If yes, when and what did the pharmacy advise?)  Preferred Pharmacy (with phone number or street name): [place], [place]  Agent: Please be advised that RX refills may take up to 3 business days. We ask that you follow-up with your pharmacy.

## 2017-10-05 MED ORDER — RIZATRIPTAN BENZOATE 10 MG PO TABS: ORAL_TABLET | ORAL | 11 refills | Status: DC

## 2017-10-05 MED ORDER — MELOXICAM 15 MG PO TABS: 15.0000 mg | ORAL_TABLET | Freq: Every day | ORAL | 3 refills | Status: DC

## 2017-10-05 MED ORDER — TIZANIDINE HCL 2 MG PO TABS: ORAL_TABLET | ORAL | 3 refills | Status: DC

## 2017-10-06 NOTE — Progress Notes (Signed)
[facility] [place], [place] [address] Phone: [phone] Subjective:     CC: Right knee pain  [id number]  Leslie Hughes is a 42 y.o. female coming in with complaint of right knee pain. She has noticed a difference as she is able to walk up stairs without pain.  Patient is here for a number 3 out of 4 injections for Visco supplementation.  Minimal pain whatsoever still remaining.      Past Medical History:  Diagnosis Date  . Allergy   . Cervical dystonia    sees Dr. Tonye Royalty at [facility]   . Degenerative arthritis of right knee 09/10/2015   Started viscous supplementation impression 22nd 2017  . Metrorrhagia    sees Dr. Marylynn Pearson   . Migraine   . Vitamin D deficiency    Past Surgical History:  Procedure Laterality Date  . CESAREAN SECTION  2009  . [place] and 2008   arthroscopy to right knee (tennis injuries)   Social History   Socioeconomic History  . Marital status: Married    Spouse name: Not on file  . Number of children: Not on file  . Years of education: Not on file  . Highest education level: Not on file  Occupational History  . Not on file  Social Needs  . Financial resource strain: Not on file  . Food insecurity:    Worry: Not on file    Inability: Not on file  . Transportation needs:    Medical: Not on file    Non-medical: Not on file  Tobacco Use  . Smoking status: Never Smoker  . Smokeless tobacco: Never Used  Substance and Sexual Activity  . Alcohol use: Yes    Alcohol/week: 0.0 oz    Comment: occ  . Drug use: No  . Sexual activity: Not on file  Lifestyle  . Physical activity:    Days per week: Not on file    Minutes per session: Not on file  . Stress: Not on file  Relationships  . Social connections:    Talks on phone: Not on file    Gets together: Not on file    Attends religious service: Not on file    Active member of club or organization: Not on file    Attends  meetings of clubs or organizations: Not on file    Relationship status: Not on file  Other Topics Concern  . Not on file  Social History Narrative  . Not on file   Allergies  Allergen Reactions  . Amoxicillin    Family History  Problem Relation Age of Onset  . Hypothyroidism Mother   . Thyroid disease Mother   . Cancer Father        bladder  . Hyperlipidemia Father   . Hypertension Father   . Skin cancer Paternal Grandmother   . Colon cancer Paternal Grandfather      Past medical history, social, surgical and family history all reviewed in electronic medical record.  No pertanent information unless stated regarding to the chief complaint.   Review of Systems:Review of systems updated and as accurate as of 10/09/17  No headache, visual changes, nausea, vomiting, diarrhea, constipation, dizziness, abdominal pain, skin rash, fevers, chills, night sweats, weight loss, swollen lymph nodes, body aches, joint swelling, muscle aches, chest pain, shortness of breath, mood changes.   Objective  Blood pressure 112/82, pulse (!) 58, height 5\' 5"  (1.651 m), weight 135  lb (61.2 kg), SpO2 98 %. Systems examined below as of 10/09/17   General: No apparent distress alert and oriented x3 mood and affect normal, dressed appropriately.  HEENT: Pupils equal, extraocular movements intact  Respiratory: Patient's speak in full sentences and does not appear short of breath  Cardiovascular: No lower extremity edema, non tender, no erythema  Skin: Warm dry intact with no signs of infection or rash on extremities or on axial skeleton.  Abdomen: Soft nontender  Neuro: Cranial nerves II through XII are intact, neurovascularly intact in all extremities with 2+ DTRs and 2+ pulses.  Lymph: No lymphadenopathy of posterior or anterior cervical chain or axillae bilaterally.  Gait normal with good balance and coordination.  MSK:  Non tender with full range of motion and good stability and symmetric strength and  tone of shoulders, elbows, wrist, hip, and ankles bilaterally.  Knee: Right Normal to inspection with no erythema or effusion or obvious bony abnormalities. Tenderness over the lateral joint line ROM full in flexion and extension and lower leg rotation. Ligaments with solid consistent endpoints including ACL, PCL, LCL, MCL. Negative Mcmurray's, Apley's, and Thessalonian tests. painful patellar compression. Patellar glide with moderate crepitus. Patellar and quadriceps tendons unremarkable. Hamstring and quadriceps strength is normal. Contralateral knee unremarkable  After informed written and verbal consent, patient was seated on exam table. Right knee was prepped with alcohol swab and utilizing anterolateral approach, patient's right knee space was injected with15 mg/2.5 mL of Orthovisc(sodium hyaluronate) in a prefilled syringe was injected easily into the knee through a 22-gauge needle..Patient tolerated the procedure well without immediate complications.    Impression and Recommendations:     This case required medical decision making of moderate complexity.      Note: This dictation was prepared with Dragon dictation along with smaller phrase technology. Any transcriptional errors that result from this process are unintentional.

## 2017-10-09 ENCOUNTER — Ambulatory Visit (INDEPENDENT_AMBULATORY_CARE_PROVIDER_SITE_OTHER): Payer: Commercial Managed Care - PPO | Admitting: Family Medicine

## 2017-10-09 ENCOUNTER — Encounter: Payer: Self-pay | Admitting: Family Medicine

## 2017-10-09 DIAGNOSIS — M1711 Unilateral primary osteoarthritis, right knee: Secondary | ICD-10-CM

## 2017-10-09 NOTE — Patient Instructions (Signed)
Good to see you  One to go! See you soon!

## 2017-10-09 NOTE — Assessment & Plan Note (Signed)
Patient doing much better after these injections.  3 now down in 1 more ago.  Discussed continued conservative therapy and follow-up in 1 week for the fourth and final injection

## 2017-10-16 NOTE — Progress Notes (Signed)
[facility] [place], [place] [address] Phone: [phone] Subjective:    I'm seeing this patient by the request  of:    CC: Right knee pain follow-up  [id number]  Leslie Hughes is a 42 y.o. female coming in with complaint of knee pain. Knee is doing great.  Here for fourth and final Visco supplementation injection     Past Medical History:  Diagnosis Date  . Allergy   . Cervical dystonia    sees Dr. Tonye Royalty at [facility]   . Degenerative arthritis of right knee 09/10/2015   Started viscous supplementation impression 22nd 2017  . Metrorrhagia    sees Dr. Marylynn Pearson   . Migraine   . Vitamin D deficiency    Past Surgical History:  Procedure Laterality Date  . CESAREAN SECTION  2009  . [place] and 2008   arthroscopy to right knee (tennis injuries)   Social History   Socioeconomic History  . Marital status: Married    Spouse name: Not on file  . Number of children: Not on file  . Years of education: Not on file  . Highest education level: Not on file  Occupational History  . Not on file  Social Needs  . Financial resource strain: Not on file  . Food insecurity:    Worry: Not on file    Inability: Not on file  . Transportation needs:    Medical: Not on file    Non-medical: Not on file  Tobacco Use  . Smoking status: Never Smoker  . Smokeless tobacco: Never Used  Substance and Sexual Activity  . Alcohol use: Yes    Alcohol/week: 0.0 oz    Comment: occ  . Drug use: No  . Sexual activity: Not on file  Lifestyle  . Physical activity:    Days per week: Not on file    Minutes per session: Not on file  . Stress: Not on file  Relationships  . Social connections:    Talks on phone: Not on file    Gets together: Not on file    Attends religious service: Not on file    Active member of club or organization: Not on file    Attends meetings of clubs or organizations: Not on file    Relationship  status: Not on file  Other Topics Concern  . Not on file  Social History Narrative  . Not on file   Allergies  Allergen Reactions  . Amoxicillin    Family History  Problem Relation Age of Onset  . Hypothyroidism Mother   . Thyroid disease Mother   . Cancer Father        bladder  . Hyperlipidemia Father   . Hypertension Father   . Skin cancer Paternal Grandmother   . Colon cancer Paternal Grandfather      Past medical history, social, surgical and family history all reviewed in electronic medical record.  No pertanent information unless stated regarding to the chief complaint.   Review of Systems:Review of systems updated and as accurate as of 10/18/17  No headache, visual changes, nausea, vomiting, diarrhea, constipation, dizziness, abdominal pain, skin rash, fevers, chills, night sweats, weight loss, swollen lymph nodes, body aches, joint swelling, muscle aches, chest pain, shortness of breath, mood changes.  Positive muscle aches  Objective  Blood pressure 100/84, pulse 75, height 5\' 5"  (1.651 m), weight 135 lb (61.2 kg), SpO2 90 %. Systems examined below as of  10/18/17   General: No apparent distress alert and oriented x3 mood and affect normal, dressed appropriately.  HEENT: Pupils equal, extraocular movements intact  Respiratory: Patient's speak in full sentences and does not appear short of breath  Cardiovascular: No lower extremity edema, non tender, no erythema  Skin: Warm dry intact with no signs of infection or rash on extremities or on axial skeleton.  Abdomen: Soft nontender  Neuro: Cranial nerves II through XII are intact, neurovascularly intact in all extremities with 2+ DTRs and 2+ pulses.  Lymph: No lymphadenopathy of posterior or anterior cervical chain or axillae bilaterally.  Gait normal with good balance and coordination.  MSK:  Non tender with full range of motion and good stability and symmetric strength and tone of shoulders, elbows, wrist, hip, and  ankles bilaterally.  Knee: Right No significant deformity Tender to palpation over medial and PF joint line.  ROM full in flexion and extension and lower leg rotation. instability with valgus force.  painful patellar compression. Patellar glide with moderate crepitus. Patellar and quadriceps tendons unremarkable. Hamstring and quadriceps strength is normal. Contralateral knee shows contralateral knee unremarkable  After informed written and verbal consent, patient was seated on exam table. Right knee was prepped with alcohol swab and utilizing anterolateral approach, patient's right knee space was injected with15 mg/2.5 mL of Orthovisc(sodium hyaluronate) in a prefilled syringe was injected easily into the knee through a 22-gauge needle..Patient tolerated the procedure well without immediate complications.   Impression and Recommendations:     This case required medical decision making of moderate complexity.      Note: This dictation was prepared with Dragon dictation along with smaller phrase technology. Any transcriptional errors that result from this process are unintentional.

## 2017-10-18 ENCOUNTER — Encounter: Payer: Self-pay | Admitting: Family Medicine

## 2017-10-18 ENCOUNTER — Ambulatory Visit (INDEPENDENT_AMBULATORY_CARE_PROVIDER_SITE_OTHER): Payer: Commercial Managed Care - PPO | Admitting: Family Medicine

## 2017-10-18 DIAGNOSIS — M1711 Unilateral primary osteoarthritis, right knee: Secondary | ICD-10-CM | POA: Diagnosis not present

## 2017-10-18 MED ORDER — PREDNISONE 50 MG PO TABS: 50.0000 mg | ORAL_TABLET | Freq: Every day | ORAL | 0 refills | Status: DC

## 2017-10-18 NOTE — Patient Instructions (Signed)
e

## 2017-10-18 NOTE — Assessment & Plan Note (Signed)
Fourth and final Visco supplementation given today.  Follow-up in 4 weeks if any worsening pain otherwise continue conservative therapy and see me as needed

## 2017-10-25 ENCOUNTER — Encounter: Payer: Self-pay | Admitting: Family Medicine

## 2017-10-25 ENCOUNTER — Ambulatory Visit: Payer: Commercial Managed Care - PPO | Admitting: Family Medicine

## 2017-10-25 VITALS — BP 94/60 | HR 90 | Temp 98.4°F | Ht 65.0 in | Wt 134.6 lb

## 2017-10-25 DIAGNOSIS — L509 Urticaria, unspecified: Secondary | ICD-10-CM | POA: Insufficient documentation

## 2017-10-25 MED ORDER — PREDNISONE 10 MG PO TABS: ORAL_TABLET | ORAL | 0 refills | Status: DC

## 2017-10-25 NOTE — Progress Notes (Signed)
   Subjective:    Patient ID: Leslie Hughes, female    DOB: 03-Nov-1975, 42 y.o.   MRN: [id number]  HPI Here for one week of hives. This is worse in the mornings and clears to an extent during the day, then in the evenings it gets worse again. It involves the entire body except the scalp and face. No lip swelling or SOB. This started the morning after she ate a dinner of oysters [employer] and [employer]. She has had both of these in the past. She saw Dr. Charlann Boxer and he gave her a 5 day course of Prednisone 50 mg a day, which she finished yesterday. This seemed to help a little. She is also taking Claritin once daily. She has had no recent changes in soaps, detergents, make up, etc. She already has an appt with an allergy specialist named Dr. Dyanne Carrel in [place] in 2 weeks to evaluate this.    Review of Systems  Constitutional: Negative.   Respiratory: Negative.   Cardiovascular: Negative.   Skin: Positive for rash.  Neurological: Negative.        Objective:   Physical Exam  Constitutional: She is oriented to person, place, and time. She appears well-developed and well-nourished. No distress.  Cardiovascular: Normal rate, regular rhythm, normal heart sounds and intact distal pulses.  Pulmonary/Chest: Effort normal and breath sounds normal. No stridor. No respiratory distress. She has no wheezes. She has no rales.  Neurological: She is alert and oriented to person, place, and time.  Skin:  Mild diffuse urticaria on the proximal arms and legs, as well as the trunk. 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 She will avoid these in the future. Switch from Claritin to Zyrtec 10 mg bid and add Zantac 150 mg bid. She will begin a taper of Prednisone over the next 20 days starting with 40 mg a day. See the allergist as scheduled.  Leslie Penna,  MD

## 2017-11-01 ENCOUNTER — Other Ambulatory Visit: Payer: Self-pay | Admitting: Family Medicine

## 2017-11-06 ENCOUNTER — Other Ambulatory Visit: Payer: Self-pay | Admitting: Family Medicine

## 2017-11-08 ENCOUNTER — Telehealth: Payer: Self-pay

## 2017-11-08 DIAGNOSIS — J300 Vasomotor rhinitis: Secondary | ICD-10-CM | POA: Diagnosis not present

## 2017-11-08 DIAGNOSIS — L501 Idiopathic urticaria: Secondary | ICD-10-CM | POA: Diagnosis not present

## 2017-11-08 NOTE — Telephone Encounter (Signed)
Fax from [facility]   RF request for RIZATRIPTAN   Last refilled 10/2017 sent to [facility]   Is pt wanting Rx switch to [facility]?  Will need to call patient.

## 2017-11-09 NOTE — Telephone Encounter (Signed)
Called and spoke with pt. Pt would like for the prescription to stay at [facility].

## 2017-11-13 ENCOUNTER — Encounter: Payer: Self-pay | Admitting: Family Medicine

## 2017-11-17 DIAGNOSIS — L501 Idiopathic urticaria: Secondary | ICD-10-CM | POA: Diagnosis not present

## 2017-11-19 NOTE — Progress Notes (Deleted)
[facility] [address], [place] [address] Phone: [phone] Subjective:    I'm seeing this patient by the request  of:    CC:   [id number]  Leslie Hughes is a 42 y.o. female coming in with complaint of ***  Onset-  Location Duration-  Character- Aggravating factors- Reliving factors-  Therapies tried-  Severity-     Past Medical History:  Diagnosis Date  . Allergy   . Cervical dystonia    sees Dr. Tonye Royalty at [facility]   . Degenerative arthritis of right knee 09/10/2015   Started viscous supplementation impression 22nd 2017  . Metrorrhagia    sees Dr. Marylynn Pearson   . Migraine   . Vitamin D deficiency    Past Surgical History:  Procedure Laterality Date  . CESAREAN SECTION  2009  . [place] and 2008   arthroscopy to right knee (tennis injuries)   Social History   Socioeconomic History  . Marital status: Married    Spouse name: Not on file  . Number of children: Not on file  . Years of education: Not on file  . Highest education level: Not on file  Occupational History  . Not on file  Social Needs  . Financial resource strain: Not on file  . Food insecurity:    Worry: Not on file    Inability: Not on file  . Transportation needs:    Medical: Not on file    Non-medical: Not on file  Tobacco Use  . Smoking status: Never Smoker  . Smokeless tobacco: Never Used  Substance and Sexual Activity  . Alcohol use: Yes    Alcohol/week: 0.0 standard drinks    Comment: occ  . Drug use: No  . Sexual activity: Not on file  Lifestyle  . Physical activity:    Days per week: Not on file    Minutes per session: Not on file  . Stress: Not on file  Relationships  . Social connections:    Talks on phone: Not on file    Gets together: Not on file    Attends religious service: Not on file    Active member of club or organization: Not on file    Attends meetings of clubs or organizations: Not on file   Relationship status: Not on file  Other Topics Concern  . Not on file  Social History Narrative  . Not on file   Allergies  Allergen Reactions  . Amoxicillin    Family History  Problem Relation Age of Onset  . Hypothyroidism Mother   . Thyroid disease Mother   . Cancer Father        bladder  . Hyperlipidemia Father   . Hypertension Father   . Skin cancer Paternal Grandmother   . Colon cancer Paternal Grandfather     Current Outpatient Medications (Endocrine & Metabolic):  Marland Kitchen  Norethin Ace-Eth Estrad-FE (LOESTRIN FE 1/20 PO), Take by mouth daily. Lo lo estrin .  predniSONE (DELTASONE) 10 MG tablet, Take 4 tabs a day for 5 days, then 3 a day for 5 days, then 2 a day for 5 days, then 1 a day for 5 days, then stop   Current Outpatient Medications (Respiratory):  .  HYDROcodone-homatropine (HYDROMET) 5-1.5 MG/5ML syrup, Take 5 mLs by mouth every 4 (four) hours as needed. (Patient not taking: Reported on 10/25/2017)  Current Outpatient Medications (Analgesics):  .  meloxicam (MOBIC) 15 MG tablet, Take 1 tablet (  15 mg total) by mouth daily. (Patient not taking: Reported on 10/25/2017) .  rizatriptan (MAXALT) 10 MG tablet, TAKE 1 TABLET BY MOUTH AS NEEDED FOR MIGRAINE, MAY REPEAT ONCE IN 2 HOURS AS NEEDED (Patient not taking: Reported on 10/25/2017)   Current Outpatient Medications (Other):  Marland Kitchen  Diclofenac Sodium 2 % SOLN, Apply 1 pump twice daily. (Patient not taking: Reported on 10/25/2017) .  Multiple Vitamin (MULTIVITAMIN) tablet, Take 1 tablet by mouth daily.   .  NONFORMULARY OR COMPOUNDED ITEM, Progesterone cream 75 mg/ml, to apply 0.5 ml to each inner thigh daily on days 1-25 of the monthly cycle .  Progesterone Micronized (PROGESTERONE, BULK,) POWD, APPLY 4.5WU (2 CLICKS) TO EACH INNER THIGH DAILY ON DAYS 1-25 OF CYCLE. Marland Kitchen  temazepam (RESTORIL) 30 MG capsule, TAKE ONE CAPSULE BY MOUTH AT BEDTIME .  tiZANidine (ZANAFLEX) 2 MG tablet, TAKE 1 TABLET BY MOUTH EVERY MORNING AND 2 TABLETS  EVERY EVENING .  venlafaxine XR (EFFEXOR XR) 75 MG 24 hr capsule, Take 1 capsule (75 mg total) by mouth daily with breakfast. .  Vitamin D, Ergocalciferol, (DRISDOL) 50000 units CAPS capsule, TAKE 1 CAPSULE BY MOUTH  EVERY 7 DAYS    Past medical history, social, surgical and family history all reviewed in electronic medical record.  No pertanent information unless stated regarding to the chief complaint.   Review of Systems:  No headache, visual changes, nausea, vomiting, diarrhea, constipation, dizziness, abdominal pain, skin rash, fevers, chills, night sweats, weight loss, swollen lymph nodes, body aches, joint swelling, muscle aches, chest pain, shortness of breath, mood changes.   Objective  There were no vitals taken for this visit. Systems examined below as of    General: No apparent distress alert and oriented x3 mood and affect normal, dressed appropriately.  HEENT: Pupils equal, extraocular movements intact  Respiratory: Patient's speak in full sentences and does not appear short of breath  Cardiovascular: No lower extremity edema, non tender, no erythema  Skin: Warm dry intact with no signs of infection or rash on extremities or on axial skeleton.  Abdomen: Soft nontender  Neuro: Cranial nerves II through XII are intact, neurovascularly intact in all extremities with 2+ DTRs and 2+ pulses.  Lymph: No lymphadenopathy of posterior or anterior cervical chain or axillae bilaterally.  Gait normal with good balance and coordination.  MSK:  Non tender with full range of motion and good stability and symmetric strength and tone of shoulders, elbows, wrist, hip, knee and ankles bilaterally.     Impression and Recommendations:     This case required medical decision making of moderate complexity. The above documentation has been reviewed and is accurate and complete Lyndal Pulley, DO       Note: This dictation was prepared with Dragon dictation along with smaller phrase  technology. Any transcriptional errors that result from this process are unintentional.

## 2017-11-21 ENCOUNTER — Ambulatory Visit: Payer: Commercial Managed Care - PPO | Admitting: Family Medicine

## 2017-11-30 DIAGNOSIS — J300 Vasomotor rhinitis: Secondary | ICD-10-CM | POA: Diagnosis not present

## 2017-11-30 DIAGNOSIS — L501 Idiopathic urticaria: Secondary | ICD-10-CM | POA: Diagnosis not present

## 2017-12-28 DIAGNOSIS — L501 Idiopathic urticaria: Secondary | ICD-10-CM | POA: Diagnosis not present

## 2018-01-09 DIAGNOSIS — L508 Other urticaria: Secondary | ICD-10-CM | POA: Diagnosis not present

## 2018-01-09 DIAGNOSIS — R5383 Other fatigue: Secondary | ICD-10-CM | POA: Diagnosis not present

## 2018-01-09 DIAGNOSIS — G249 Dystonia, unspecified: Secondary | ICD-10-CM | POA: Diagnosis not present

## 2018-01-17 ENCOUNTER — Other Ambulatory Visit: Payer: Self-pay | Admitting: Family Medicine

## 2018-01-25 ENCOUNTER — Other Ambulatory Visit: Payer: Self-pay | Admitting: Family Medicine

## 2018-01-25 DIAGNOSIS — L501 Idiopathic urticaria: Secondary | ICD-10-CM | POA: Diagnosis not present

## 2018-01-31 ENCOUNTER — Other Ambulatory Visit: Payer: Self-pay | Admitting: Family Medicine

## 2018-01-31 MED ORDER — VENLAFAXINE HCL ER 37.5 MG PO CP24: 37.5000 mg | ORAL_CAPSULE | Freq: Every day | ORAL | 1 refills | Status: DC

## 2018-02-01 ENCOUNTER — Encounter: Payer: Self-pay | Admitting: Family Medicine

## 2018-02-08 ENCOUNTER — Encounter (HOSPITAL_BASED_OUTPATIENT_CLINIC_OR_DEPARTMENT_OTHER): Payer: Self-pay | Admitting: *Deleted

## 2018-02-08 ENCOUNTER — Other Ambulatory Visit: Payer: Self-pay

## 2018-02-08 ENCOUNTER — Emergency Department (HOSPITAL_BASED_OUTPATIENT_CLINIC_OR_DEPARTMENT_OTHER)
Admission: EM | Admit: 2018-02-08 | Discharge: 2018-02-08 | Disposition: A | Discharge status: Home / Self Care | Payer: Commercial Managed Care - PPO | Attending: Emergency Medicine | Admitting: Emergency Medicine

## 2018-02-08 DIAGNOSIS — R110 Nausea: Secondary | ICD-10-CM

## 2018-02-08 DIAGNOSIS — R509 Fever, unspecified: Secondary | ICD-10-CM | POA: Diagnosis present

## 2018-02-08 DIAGNOSIS — R195 Other fecal abnormalities: Secondary | ICD-10-CM | POA: Diagnosis not present

## 2018-02-08 DIAGNOSIS — R7989 Other specified abnormal findings of blood chemistry: Secondary | ICD-10-CM | POA: Diagnosis not present

## 2018-02-08 DIAGNOSIS — R5383 Other fatigue: Secondary | ICD-10-CM | POA: Diagnosis not present

## 2018-02-08 DIAGNOSIS — Z79899 Other long term (current) drug therapy: Secondary | ICD-10-CM | POA: Insufficient documentation

## 2018-02-08 DIAGNOSIS — K921 Melena: Secondary | ICD-10-CM | POA: Diagnosis not present

## 2018-02-08 DIAGNOSIS — L501 Idiopathic urticaria: Secondary | ICD-10-CM | POA: Diagnosis not present

## 2018-02-08 LAB — URINALYSIS, ROUTINE W REFLEX MICROSCOPIC
Bilirubin Urine: NEGATIVE
GLUCOSE, UA: NEGATIVE mg/dL
Ketones, ur: NEGATIVE mg/dL
LEUKOCYTES UA: NEGATIVE
Nitrite: NEGATIVE
Protein, ur: NEGATIVE mg/dL
SPECIFIC GRAVITY, URINE: 1.015 (ref 1.005–1.030)
pH: 5.5 (ref 5.0–8.0)

## 2018-02-08 LAB — PREGNANCY, URINE: Preg Test, Ur: NEGATIVE

## 2018-02-08 LAB — COMPREHENSIVE METABOLIC PANEL
ALBUMIN: 4.4 g/dL (ref 3.5–5.0)
ALT: 32 U/L (ref 0–44)
AST: 26 U/L (ref 15–41)
Alkaline Phosphatase: 46 U/L (ref 38–126)
Anion gap: 8 (ref 5–15)
BILIRUBIN TOTAL: 0.5 mg/dL (ref 0.3–1.2)
BUN: 12 mg/dL (ref 6–20)
CHLORIDE: 104 mmol/L (ref 98–111)
CO2: 26 mmol/L (ref 22–32)
CREATININE: 0.86 mg/dL (ref 0.44–1.00)
Calcium: 9.2 mg/dL (ref 8.9–10.3)
GFR calc Af Amer: >60 mL/min (ref >60)
GFR calc non Af Amer: >60 mL/min (ref >60)
GLUCOSE: 98 mg/dL (ref 70–99)
POTASSIUM: 3.6 mmol/L (ref 3.5–5.1)
Sodium: 138 mmol/L (ref 135–145)
TOTAL PROTEIN: 7.6 g/dL (ref 6.5–8.1)

## 2018-02-08 LAB — CBC WITH DIFFERENTIAL/PLATELET
ABS IMMATURE GRANULOCYTES: 0.02 10*3/uL (ref 0.00–0.07)
BASOS ABS: 0 10*3/uL (ref 0.0–0.1)
Basophils Relative: 0 %
EOS ABS: 0 10*3/uL (ref 0.0–0.5)
Eosinophils Relative: 0 %
HEMATOCRIT: 41 % (ref 36.0–46.0)
Hemoglobin: 13.6 g/dL (ref 12.0–15.0)
IMMATURE GRANULOCYTES: 0 %
LYMPHS ABS: 1.9 10*3/uL (ref 0.7–4.0)
LYMPHS PCT: 24 %
MCH: 30.6 pg (ref 26.0–34.0)
MCHC: 33.2 g/dL (ref 30.0–36.0)
MCV: 92.3 fL (ref 80.0–100.0)
MONOS PCT: 7 %
Monocytes Absolute: 0.5 10*3/uL (ref 0.1–1.0)
NEUTROS PCT: 69 %
NRBC: 0 % (ref 0.0–0.2)
Neutro Abs: 5.5 10*3/uL (ref 1.7–7.7)
Platelets: 252 10*3/uL (ref 150–400)
RBC: 4.44 MIL/uL (ref 3.87–5.11)
RDW: 11.9 % (ref 11.5–15.5)
WBC: 8 10*3/uL (ref 4.0–10.5)

## 2018-02-08 LAB — URINALYSIS, MICROSCOPIC (REFLEX): SQUAMOUS EPITHELIAL / LPF: NONE SEEN (ref 0–5)

## 2018-02-08 LAB — OCCULT BLOOD X 1 CARD TO LAB, STOOL: Fecal Occult Bld: NEGATIVE

## 2018-02-08 NOTE — ED Provider Notes (Signed)
[facility] EMERGENCY DEPARTMENT Provider Note   CSN: [id number] Arrival date & time: 02/08/18  1354     History   Chief Complaint Chief Complaint  Patient presents with  . Fever    HPI Leslie Hughes is a 42 y.o. female with past medical history of idiopathic urticaria, migraine headaches, presenting to the emergency department with complaint of intermittent thick dark tarry stools x2 weeks.  She states she has had about 5 episodes in the past 2 weeks with intermittent diarrhea.  Reports associated constant nausea that is better with eating.  States she has had longtime history of intermittent right lower quadrant pain attributed to an ovarian cyst though this pain has been unchanging and not worsening.  She also states the intermittent diarrhea has been occurring for some time and is not new.  The only new symptom is the dark tarry stools.  She endorses intermittent low-grade fevers throughout the day, 100.1 F at primary care today.  Endorses night sweats as well during the past week.  She is currently being treated by an allergist and on multiple antihistamines as well as prednisone and Xolair injections for hives due to an unknown cause since July.  She reports history of gastric ulcers back in college, and some of the symptoms feel similar including the nausea that is better with eating.  She denies anticoagulation use, history of hemorrhoids.  No medications tried for symptoms.  Is not taking iron supplement or Pepto-Bismol.  No pelvic complaints.  The history is provided by the patient.    Past Medical History:  Diagnosis Date  . Allergy   . Cervical dystonia    sees Dr. Tonye Royalty at [facility]   . Degenerative arthritis of right knee 09/10/2015   Started viscous supplementation impression 22nd 2017  . Metrorrhagia    sees Dr. Marylynn Pearson   . Migraine   . Vitamin D deficiency     Patient Active Problem List   Diagnosis Date Noted  . Hives 10/25/2017  .  Nonallopathic lesion of cervical region 05/10/2017  . Nonallopathic lesion of thoracic region 05/10/2017  . Nonallopathic lesion of rib cage 05/10/2017  . Cervical radiculopathy at C6 05/01/2017  . Pain in left tibia 03/02/2017  . Patellar subluxation, right, initial encounter 06/24/2016  . Tear of LCL (lateral collateral ligament) of knee, right, initial encounter 12/31/2015  . Sprain, quadricep 12/31/2015  . Degenerative arthritis of right knee 09/10/2015  . Leg length discrepancy 06/23/2015  . Medial tibial stress syndrome 01/13/2015  . Right knee pain 01/12/2015  . Vitamin D deficiency 09/19/2011  . Migraines 09/09/2010  . Cervical dystonia 09/09/2010  . Metrorrhagia 09/09/2010    Past Surgical History:  Procedure Laterality Date  . CESAREAN SECTION  2009  . [place] and 2008   arthroscopy to right knee (tennis injuries)     OB History   None      Home Medications    Prior to Admission medications   Medication Sig Start Date End Date Taking? Authorizing Provider  cetirizine (ZYRTEC) 10 MG tablet Take 20 mg by mouth 2 (two) times daily.   Yes [provider]  diphenhydrAMINE (BENADRYL) 25 MG tablet Take 50 mg by mouth 2 (two) times daily with a meal. Breakfast and dinner   Yes [provider]  fexofenadine (ALLEGRA) 180 MG tablet Take 180 mg by mouth 3 times/day as needed-between meals & bedtime for allergies or rhinitis.   Yes [provider]  hydrOXYzine (ATARAX/VISTARIL) 10 MG tablet Take 30 mg by mouth at bedtime.   Yes [provider]  omalizumab Arvid Right) 150 MG injection Inject 300 mg into the skin every 28 (twenty-eight) days.   Yes [provider]  ranitidine (ZANTAC) 150 MG tablet Take 150 mg by mouth 2 (two) times daily.   Yes [provider]  Diclofenac Sodium 2 % SOLN Apply 1 pump twice daily. Patient not taking: Reported on 10/25/2017 02/02/15   Lyndal Pulley, DO  HYDROcodone-homatropine  (HYDROMET) 5-1.5 MG/5ML syrup Take 5 mLs by mouth every 4 (four) hours as needed. Patient not taking: Reported on 10/25/2017 04/19/17   Laurey Morale, MD  meloxicam (MOBIC) 15 MG tablet Take 1 tablet (15 mg total) by mouth daily. Patient not taking: Reported on 10/25/2017 10/05/17   Laurey Morale, MD  Multiple Vitamin (MULTIVITAMIN) tablet Take 1 tablet by mouth daily.      [provider]  NONFORMULARY OR COMPOUNDED ITEM Progesterone cream 75 mg/ml, to apply 0.5 ml to each inner thigh daily on days 1-25 of the monthly cycle 07/16/15   Laurey Morale, MD  Norethin Ace-Eth Estrad-FE (LOESTRIN FE 1/20 PO) Take by mouth daily. Lo lo estrin    [provider]  predniSONE (DELTASONE) 10 MG tablet Take 4 tabs a day for 5 days, then 3 a day for 5 days, then 2 a day for 5 days, then 1 a day for 5 days, then stop 10/25/17   Laurey Morale, MD  Progesterone Micronized (PROGESTERONE, BULK,) POWD APPLY 3.7TK (2 CLICKS) TO EACH INNER THIGH DAILY ON DAYS 1-25 OF CYCLE. 10/02/17   Laurey Morale, MD  rizatriptan (MAXALT) 10 MG tablet TAKE 1 TABLET BY MOUTH AS NEEDED FOR MIGRAINE, MAY REPEAT ONCE IN 2 HOURS AS NEEDED Patient not taking: Reported on 10/25/2017 10/05/17   Laurey Morale, MD  temazepam (RESTORIL) 30 MG capsule TAKE ONE CAPSULE BY MOUTH AT BEDTIME 02/06/15   Laurey Morale, MD  tiZANidine (ZANAFLEX) 2 MG tablet TAKE 1 TABLET BY MOUTH EVERY MORNING AND 2 TABLETS EVERY EVENING 10/05/17   Laurey Morale, MD  venlafaxine XR (EFFEXOR XR) 37.5 MG 24 hr capsule Take 1 capsule (37.5 mg total) by mouth daily with breakfast. 01/31/18   Lyndal Pulley, DO  Vitamin D, Ergocalciferol, (DRISDOL) 50000 units CAPS capsule TAKE 1 CAPSULE BY MOUTH  EVERY 7 DAYS 11/06/17   Lyndal Pulley, DO    Family History Family History  Problem Relation Age of Onset  . Hypothyroidism Mother   . Thyroid disease Mother   . Cancer Father        bladder  . Hyperlipidemia Father   . Hypertension Father   . Skin cancer  Paternal Grandmother   . Colon cancer Paternal Grandfather     Social History Social History   Tobacco Use  . Smoking status: Never Smoker  . Smokeless tobacco: Never Used  Substance Use Topics  . Alcohol use: Yes    Alcohol/week: 0.0 standard drinks    Comment: occ  . Drug use: No     Allergies   Amoxicillin   Review of Systems Review of Systems  Gastrointestinal: Positive for blood in stool, diarrhea and nausea. Negative for vomiting.  All other systems reviewed and are negative.    Physical Exam Updated Vital Signs BP (!) 124/94 (BP Location: Right Arm)   Pulse 87   Temp 98.1 F (36.7 C) (Oral)   Resp 16  Ht 5\' 5"  (1.651 m)   Wt 64 kg   SpO2 99%   BMI 23.46 kg/m   Physical Exam  Constitutional: She appears well-developed and well-nourished. No distress.  HENT:  Head: Normocephalic and atraumatic.  Eyes: Conjunctivae are normal.  Cardiovascular: Normal rate, regular rhythm, normal heart sounds and intact distal pulses.  Pulmonary/Chest: Effort normal and breath sounds normal. No respiratory distress.  Abdominal: Soft. Bowel sounds are normal. She exhibits no distension and no mass. There is no tenderness. There is no rebound and no guarding.  Genitourinary:  Genitourinary Comments: Rectal exam performed with female RN chaperone present. NO hemorrhoids. No tenderness. No gross blood. Stool is brown in color.  Neurological: She is alert.  Skin: Skin is warm.  Psychiatric: She has a normal mood and affect. Her behavior is normal.  Nursing note and vitals reviewed.    ED Treatments / Results  Labs (all labs ordered are listed, but only abnormal results are displayed) Labs Reviewed  URINALYSIS, ROUTINE W REFLEX MICROSCOPIC - Abnormal; Notable for the following components:      Result Value   Hgb urine dipstick TRACE (*)    All other components within normal limits  URINALYSIS, MICROSCOPIC (REFLEX) - Abnormal; Notable for the following components:    Bacteria, UA RARE (*)    All other components within normal limits  PREGNANCY, URINE  CBC WITH DIFFERENTIAL/PLATELET  COMPREHENSIVE METABOLIC PANEL  OCCULT BLOOD X 1 CARD TO LAB, STOOL    EKG None  Radiology No results found.  Procedures Procedures (including critical care time)  Medications Ordered in ED Medications - No data to display   Initial Impression / Assessment and Plan / ED Course  I have reviewed the triage vital signs and the nursing notes.  Pertinent labs & imaging results that were available during my care of the patient were reviewed by me and considered in my medical decision making (see chart for details).     Pt presenting with intermittent dark tarry stools x 2 weeks w constant nausea that is improved by eating. No assoc abd pain, urinary symptoms, pelvic complaints.  On exam, she is very well-appearing, afebrile, normal vital signs.  CBC without leukocytosis or anemia.  CMP is normal.  Pregnancy test is negative.  UA without evidence of infection.  Abdomen is soft and nontender.  Rectal exam without gross blood.  Occult is negative. Exam not concerning for acute surgical abdomen. Patient discussed with Dr. Tamera Punt.  Suspect prednisone may be causing irritation to the stomach lining, there is potential for PUD as cause of dark stools and nausea that is better with food. Plan to treat symptomatically and outpatient follow up.   Discussed symptomatic management, patient states she was recently switched from Zantac to Prilosec by her allergist.  Encouraged she continue taking Prilosec daily.  Discussed diet modifications.  GI referral provided.  Strict return precautions discussed.  She is well-appearing, agreeable to plan, safe for discharge.  Discussed results, findings, treatment and follow up. Patient advised of return precautions. Patient verbalized understanding and agreed with plan.   Final Clinical Impressions(s) / ED Diagnoses   Final diagnoses:  Dark  stools  Nausea    ED Discharge Orders    None       Toluwanimi Radebaugh, [place] N, PA-C 02/08/18 1707    Malvin Johns, MD 02/08/18 2322

## 2018-02-08 NOTE — ED Triage Notes (Signed)
Pt c/o fever and " dark tarry stools"  X 2 days , pt states she is on a lot of new meds from Allergist

## 2018-02-08 NOTE — Discharge Instructions (Signed)
Discuss your prednisone use with your allergist.  This may be causing irritation to the stomach. Take the Prilosec as prescribed by your allergist. Avoid NSAIDs including ibuprofen/Advil/Motrin, Aleve/naproxen, BC powder, Goody's powder, aspirin.  Avoid really spicy foods, acidic foods or greasy foods. You can follow-up with the gastroenterology specialist if symptoms persist.  Return to the emergency department if you develop persistent fever, uncontrollable vomiting, severe abdominal pain, large amount of bright red blood per rectum, or new or concerning symptoms.

## 2018-02-09 ENCOUNTER — Other Ambulatory Visit: Payer: Self-pay | Admitting: Family Medicine

## 2018-02-09 NOTE — Telephone Encounter (Signed)
Refill done.  

## 2018-02-20 DIAGNOSIS — R197 Diarrhea, unspecified: Secondary | ICD-10-CM | POA: Diagnosis not present

## 2018-02-20 DIAGNOSIS — L501 Idiopathic urticaria: Secondary | ICD-10-CM | POA: Diagnosis not present

## 2018-02-24 DIAGNOSIS — R112 Nausea with vomiting, unspecified: Secondary | ICD-10-CM | POA: Diagnosis not present

## 2018-02-24 DIAGNOSIS — R1013 Epigastric pain: Secondary | ICD-10-CM | POA: Diagnosis not present

## 2018-02-24 DIAGNOSIS — R1010 Upper abdominal pain, unspecified: Secondary | ICD-10-CM | POA: Diagnosis not present

## 2018-02-24 DIAGNOSIS — R197 Diarrhea, unspecified: Secondary | ICD-10-CM | POA: Diagnosis not present

## 2018-03-06 ENCOUNTER — Encounter: Payer: Self-pay | Admitting: Family Medicine

## 2018-03-06 DIAGNOSIS — L501 Idiopathic urticaria: Secondary | ICD-10-CM | POA: Diagnosis not present

## 2018-03-19 DIAGNOSIS — L501 Idiopathic urticaria: Secondary | ICD-10-CM | POA: Diagnosis not present

## 2018-03-20 DIAGNOSIS — L509 Urticaria, unspecified: Secondary | ICD-10-CM | POA: Diagnosis not present

## 2018-03-20 DIAGNOSIS — R768 Other specified abnormal immunological findings in serum: Secondary | ICD-10-CM | POA: Diagnosis not present

## 2018-03-20 DIAGNOSIS — M2550 Pain in unspecified joint: Secondary | ICD-10-CM | POA: Diagnosis not present

## 2018-04-02 DIAGNOSIS — L501 Idiopathic urticaria: Secondary | ICD-10-CM | POA: Diagnosis not present

## 2018-04-11 ENCOUNTER — Encounter: Payer: Self-pay | Admitting: Family Medicine

## 2018-04-11 DIAGNOSIS — L509 Urticaria, unspecified: Secondary | ICD-10-CM | POA: Diagnosis not present

## 2018-04-11 DIAGNOSIS — Z79899 Other long term (current) drug therapy: Secondary | ICD-10-CM | POA: Diagnosis not present

## 2018-04-11 DIAGNOSIS — R768 Other specified abnormal immunological findings in serum: Secondary | ICD-10-CM | POA: Diagnosis not present

## 2018-04-20 DIAGNOSIS — L249 Irritant contact dermatitis, unspecified cause: Secondary | ICD-10-CM | POA: Diagnosis not present

## 2018-04-20 DIAGNOSIS — L508 Other urticaria: Secondary | ICD-10-CM | POA: Diagnosis not present

## 2018-04-20 DIAGNOSIS — L821 Other seborrheic keratosis: Secondary | ICD-10-CM | POA: Diagnosis not present

## 2018-04-20 DIAGNOSIS — L729 Follicular cyst of the skin and subcutaneous tissue, unspecified: Secondary | ICD-10-CM | POA: Diagnosis not present

## 2018-04-25 ENCOUNTER — Encounter: Payer: Self-pay | Admitting: Family Medicine

## 2018-07-23 ENCOUNTER — Encounter: Payer: Self-pay | Admitting: Family Medicine

## 2018-07-25 ENCOUNTER — Ambulatory Visit: Payer: Commercial Managed Care - PPO | Admitting: Family Medicine

## 2018-07-25 ENCOUNTER — Encounter: Payer: Self-pay | Admitting: Family Medicine

## 2018-07-25 ENCOUNTER — Other Ambulatory Visit: Payer: Self-pay

## 2018-07-25 DIAGNOSIS — M1711 Unilateral primary osteoarthritis, right knee: Secondary | ICD-10-CM | POA: Diagnosis not present

## 2018-07-25 NOTE — Patient Instructions (Signed)
Good to see you  You know the drill  I hope it turns it down  We will get the approval just in case for the other injection  Have an appointment in 3-4 weeks just in case Be safe!

## 2018-07-25 NOTE — Progress Notes (Signed)
[facility] [address], [place] [address] Phone: [phone] Subjective:     CC: Right knee pain  [id number]   10/18/2017: Fourth and final Visco supplementation given today.  Follow-up in 4 weeks if any worsening pain otherwise continue conservative therapy and see me as needed  Update 07/25/2018: Leslie Hughes is a 43 y.o. female coming in with complaint of right knee pain. Patient states that her pain has increased as she has started to run again. Does experience swelling and pain post runs. Pain on medial aspect of knee.  Patient has pain on the medial aspect more.  Has had a history of the viscosupplementation necessary for the mild arthritis.  States that this is a little more medial than usual pain     Past Medical History:  Diagnosis Date  . Allergy   . Cervical dystonia    sees Dr. Tonye Royalty at [facility]   . Degenerative arthritis of right knee 09/10/2015   Started viscous supplementation impression 22nd 2017  . Metrorrhagia    sees Dr. Marylynn Pearson   . Migraine   . Vitamin D deficiency    Past Surgical History:  Procedure Laterality Date  . CESAREAN SECTION  2009  . [place] and 2008   arthroscopy to right knee (tennis injuries)   Social History   Socioeconomic History  . Marital status: Married    Spouse name: Not on file  . Number of children: Not on file  . Years of education: Not on file  . Highest education level: Not on file  Occupational History  . Not on file  Social Needs  . Financial resource strain: Not on file  . Food insecurity:    Worry: Not on file    Inability: Not on file  . Transportation needs:    Medical: Not on file    Non-medical: Not on file  Tobacco Use  . Smoking status: Never Smoker  . Smokeless tobacco: Never Used  Substance and Sexual Activity  . Alcohol use: Yes    Alcohol/week: 0.0 standard drinks    Comment: occ  . Drug use: No  . Sexual activity: Not on file   Lifestyle  . Physical activity:    Days per week: Not on file    Minutes per session: Not on file  . Stress: Not on file  Relationships  . Social connections:    Talks on phone: Not on file    Gets together: Not on file    Attends religious service: Not on file    Active member of club or organization: Not on file    Attends meetings of clubs or organizations: Not on file    Relationship status: Not on file  Other Topics Concern  . Not on file  Social History Narrative  . Not on file   Allergies  Allergen Reactions  . Amoxicillin    Family History  Problem Relation Age of Onset  . Hypothyroidism Mother   . Thyroid disease Mother   . Cancer Father        bladder  . Hyperlipidemia Father   . Hypertension Father   . Skin cancer Paternal Grandmother   . Colon cancer Paternal Grandfather     Current Outpatient Medications (Endocrine & Metabolic):  Marland Kitchen  Norethin Ace-Eth Estrad-FE (LOESTRIN FE 1/20 PO), Take by mouth daily. Lo lo estrin    Current Outpatient Medications (Analgesics):  .  meloxicam (MOBIC) 15 MG tablet,  Take 1 tablet (15 mg total) by mouth daily. .  rizatriptan (MAXALT) 10 MG tablet, TAKE 1 TABLET BY MOUTH AS NEEDED FOR MIGRAINE, MAY REPEAT ONCE IN 2 HOURS AS NEEDED   Current Outpatient Medications (Other):  Marland Kitchen  Diclofenac Sodium 2 % SOLN, Apply 1 pump twice daily. .  Multiple Vitamin (MULTIVITAMIN) tablet, Take 1 tablet by mouth daily.   .  Progesterone Micronized (PROGESTERONE, BULK,) POWD, APPLY 8.5ID (2 CLICKS) TO EACH INNER THIGH DAILY ON DAYS 1-25 OF CYCLE. Marland Kitchen  tiZANidine (ZANAFLEX) 2 MG tablet, TAKE 1 TABLET BY MOUTH EVERY MORNING AND 2 TABLETS EVERY EVENING .  Vitamin D, Ergocalciferol, (DRISDOL) 50000 units CAPS capsule, TAKE 1 CAPSULE BY MOUTH  EVERY 7 DAYS    Past medical history, social, surgical and family history all reviewed in electronic medical record.  No pertanent information unless stated regarding to the chief complaint.   Review of  Systems:  No headache, visual changes, nausea, vomiting, diarrhea, constipation, dizziness, abdominal pain, skin rash, fevers, chills, night sweats, weight loss, swollen lymph nodes, body aches, joint swelling, chest pain, shortness of breath, mood changes.  Positive muscle aches  Objective  Blood pressure 108/78, pulse 95, height 5\' 5"  (1.651 m), weight 141 lb (64 kg), SpO2 98 %.    General: No apparent distress alert and oriented x3 mood and affect normal, dressed appropriately.  HEENT: Pupils equal, extraocular movements intact  Respiratory: Patient's speak in full sentences and does not appear short of breath  Cardiovascular: No lower extremity edema, non tender, no erythema  Skin: Warm dry intact with no signs of infection or rash on extremities or on axial skeleton.  Abdomen: Soft nontender  Neuro: Cranial nerves II through XII are intact, neurovascularly intact in all extremities with 2+ DTRs and 2+ pulses.  Lymph: No lymphadenopathy of posterior or anterior cervical chain or axillae bilaterally.  Gait normal with good balance and coordination.  MSK:  Non tender with full range of motion and good stability and symmetric strength and tone of shoulders, elbows, wrist, hip, and ankles bilaterally.   Right knee exam still shows some mild crepitus noted with range of motion.  Pain over the lateral and the patellofemoral joint.  Patient has some pain over the medial joint 2.  No significant swelling.  Full range of motion.  No significant instability.   After informed written and verbal consent, patient was seated on exam table. Right knee was prepped with alcohol swab and utilizing anterolateral approach, patient's right knee space was injected with 4:1  marcaine 0.5%: Kenalog 40mg /dL. Patient tolerated the procedure well without immediate complications.   Impression and Recommendations:     This case required medical decision making of moderate complexity. The above documentation has  been reviewed and is accurate and complete Lyndal Pulley, DO       Note: This dictation was prepared with Dragon dictation along with smaller phrase technology. Any transcriptional errors that result from this process are unintentional.

## 2018-07-25 NOTE — Assessment & Plan Note (Signed)
Mild symptoms moderate.  We will attempt to get viscosupplementation improved again.  Patient given a Tru pull lite brace that I think will be beneficial with her increasing her running.  We discussed icing regimen and home exercise.  We discussed which activities to do which wants to avoid.  Patient will follow-up with me again in 3 weeks

## 2018-07-31 DIAGNOSIS — L821 Other seborrheic keratosis: Secondary | ICD-10-CM | POA: Diagnosis not present

## 2018-07-31 DIAGNOSIS — L249 Irritant contact dermatitis, unspecified cause: Secondary | ICD-10-CM | POA: Diagnosis not present

## 2018-08-09 ENCOUNTER — Other Ambulatory Visit: Payer: Self-pay | Admitting: Family Medicine

## 2018-08-14 NOTE — Progress Notes (Signed)
[facility] [address] [place], [place] [address] Phone: [phone] Subjective:   I Kandace Blitz am serving as a Education administrator for Dr. Hulan Saas.  I'm seeing this patient by the request  of:    CC: Right knee pain and left ankle pain  [id number]  Gilberto Streck is a 43 y.o. female coming in with complaint of right knee pain.  Left ankle pain as well. Has taken a week off and has noticed she is experiencing more pain. Pain last week.  Patient states symptom ankle seems to be worse in the knee.  Given the injection in the knee previously.  Patient then feels like she responded better to the Visco supplementation.  We did get approval for this at last visit.    Past Medical History:  Diagnosis Date  . Allergy   . Cervical dystonia    sees Dr. Tonye Royalty at [facility]   . Degenerative arthritis of right knee 09/10/2015   Started viscous supplementation impression 22nd 2017  . Metrorrhagia    sees Dr. Marylynn Pearson   . Migraine   . Vitamin D deficiency    Past Surgical History:  Procedure Laterality Date  . CESAREAN SECTION  2009  . [place] and 2008   arthroscopy to right knee (tennis injuries)   Social History   Socioeconomic History  . Marital status: Married    Spouse name: Not on file  . Number of children: Not on file  . Years of education: Not on file  . Highest education level: Not on file  Occupational History  . Not on file  Social Needs  . Financial resource strain: Not on file  . Food insecurity:    Worry: Not on file    Inability: Not on file  . Transportation needs:    Medical: Not on file    Non-medical: Not on file  Tobacco Use  . Smoking status: Never Smoker  . Smokeless tobacco: Never Used  Substance and Sexual Activity  . Alcohol use: Yes    Alcohol/week: 0.0 standard drinks    Comment: occ  . Drug use: No  . Sexual activity: Not on file  Lifestyle  . Physical activity:    Days per week: Not on  file    Minutes per session: Not on file  . Stress: Not on file  Relationships  . Social connections:    Talks on phone: Not on file    Gets together: Not on file    Attends religious service: Not on file    Active member of club or organization: Not on file    Attends meetings of clubs or organizations: Not on file    Relationship status: Not on file  Other Topics Concern  . Not on file  Social History Narrative  . Not on file   Allergies  Allergen Reactions  . Amoxicillin    Family History  Problem Relation Age of Onset  . Hypothyroidism Mother   . Thyroid disease Mother   . Cancer Father        bladder  . Hyperlipidemia Father   . Hypertension Father   . Skin cancer Paternal Grandmother   . Colon cancer Paternal Grandfather     Current Outpatient Medications (Endocrine & Metabolic):  Marland Kitchen  Norethin Ace-Eth Estrad-FE (LOESTRIN FE 1/20 PO), Take by mouth daily. Lo lo estrin    Current Outpatient Medications (Analgesics):  .  meloxicam (MOBIC) 15 MG tablet, Take  1 tablet (15 mg total) by mouth daily. .  rizatriptan (MAXALT) 10 MG tablet, TAKE 1 TABLET BY MOUTH AS NEEDED FOR MIGRAINE, MAY REPEAT ONCE IN 2 HOURS AS NEEDED   Current Outpatient Medications (Other):  Marland Kitchen  Diclofenac Sodium 2 % SOLN, Apply 1 pump twice daily. .  Multiple Vitamin (MULTIVITAMIN) tablet, Take 1 tablet by mouth daily.   .  Progesterone Micronized (PROGESTERONE, BULK,) POWD, APPLY 4.4IH (2 CLICKS) TO EACH INNER THIGH DAILY ON DAYS 1-25 OF CYCLE. Marland Kitchen  tiZANidine (ZANAFLEX) 2 MG tablet, TAKE 1 TABLET BY MOUTH EVERY MORNING AND 2 TABLETS EVERY EVENING .  venlafaxine XR (EFFEXOR-XR) 75 MG 24 hr capsule, TAKE 1 CAPSULE BY MOUTH  DAILY WITH BREAKFAST .  Vitamin D, Ergocalciferol, (DRISDOL) 50000 units CAPS capsule, TAKE 1 CAPSULE BY MOUTH  EVERY 7 DAYS    Past medical history, social, surgical and family history all reviewed in electronic medical record.  No pertanent information unless stated regarding  to the chief complaint.   Review of Systems:  No headache, visual changes, nausea, vomiting, diarrhea, constipation, dizziness, abdominal pain, skin rash, fevers, chills, night sweats, weight loss, swollen lymph nodes, body aches, joint swelling, muscle aches, chest pain, shortness of breath, mood changes.   Objective  Blood pressure 132/76, pulse 77, height 5\' 5"  (1.651 m), weight 149 lb (67.6 kg), SpO2 98 %.    General: No apparent distress alert and oriented x3 mood and affect normal, dressed appropriately.  HEENT: Pupils equal, extraocular movements intact  Respiratory: Patient's speak in full sentences and does not appear short of breath  Cardiovascular: No lower extremity edema, non tender, no erythema  Skin: Warm dry intact with no signs of infection or rash on extremities or on axial skeleton.  Abdomen: Soft nontender  Neuro: Cranial nerves II through XII are intact, neurovascularly intact in all extremities with 2+ DTRs and 2+ pulses.  Lymph: No lymphadenopathy of posterior or anterior cervical chain or axillae bilaterally.  Gait mild antalgic MSK:  tender with full range of motion and good stability and symmetric strength and tone of shoulders, elbows, wrist, hip, and ankles bilaterally.  Right knee exam shows that the patient does have some mild crepitus noted.  Still tenderness over the patellofemoral and the medial joint line as well. Left ankle exam shows the patient does have some tenderness to palpation distally over the medial malleolus.  Very similar to her previous exam a year ago.  Patient does have a positive Tinel's in this area.  Patient's ankle though otherwise seems to be unremarkable with right full range of motion.  Limited musculoskeletal ultrasound was performed and interpreted by Lyndal Pulley  Minute ultrasound of patient's left ankle shows that patient does have a thickening of the medial malleolus as well as proximally by 3 cm.  Mild hypoechoic changes and  increasing Doppler flow.  After informed written and verbal consent, patient was seated on exam table. Right knee was prepped with alcohol swab and utilizing anterolateral approach, patient's right knee space was injected with monovisc and 32 mg/mL patient tolerated the procedure well without immediate complications.   Impression and Recommendations:     This case required medical decision making of moderate complexity. The above documentation has been reviewed and is accurate and complete Lyndal Pulley, DO       Note: This dictation was prepared with Dragon dictation along with smaller phrase technology. Any transcriptional errors that result from this process are unintentional.

## 2018-08-15 ENCOUNTER — Ambulatory Visit: Payer: Self-pay

## 2018-08-15 ENCOUNTER — Ambulatory Visit (INDEPENDENT_AMBULATORY_CARE_PROVIDER_SITE_OTHER): Payer: Commercial Managed Care - PPO | Admitting: Family Medicine

## 2018-08-15 ENCOUNTER — Encounter: Payer: Self-pay | Admitting: Family Medicine

## 2018-08-15 ENCOUNTER — Ambulatory Visit (INDEPENDENT_AMBULATORY_CARE_PROVIDER_SITE_OTHER)
Admission: RE | Admit: 2018-08-15 | Discharge: 2018-08-15 | Disposition: A | Discharge status: Home / Self Care | Payer: Commercial Managed Care - PPO | Source: Ambulatory Visit | Attending: Family Medicine | Admitting: Family Medicine

## 2018-08-15 ENCOUNTER — Other Ambulatory Visit: Payer: Self-pay

## 2018-08-15 VITALS — BP 132/76 | HR 77 | Ht 65.0 in | Wt 149.0 lb

## 2018-08-15 DIAGNOSIS — M1711 Unilateral primary osteoarthritis, right knee: Secondary | ICD-10-CM | POA: Diagnosis not present

## 2018-08-15 DIAGNOSIS — G8929 Other chronic pain: Secondary | ICD-10-CM | POA: Diagnosis not present

## 2018-08-15 DIAGNOSIS — M85869 Other specified disorders of bone density and structure, unspecified lower leg: Secondary | ICD-10-CM

## 2018-08-15 DIAGNOSIS — S86892D Other injury of other muscle(s) and tendon(s) at lower leg level, left leg, subsequent encounter: Secondary | ICD-10-CM

## 2018-08-15 DIAGNOSIS — M25561 Pain in right knee: Secondary | ICD-10-CM

## 2018-08-15 DIAGNOSIS — M85861 Other specified disorders of bone density and structure, right lower leg: Secondary | ICD-10-CM | POA: Diagnosis not present

## 2018-08-15 DIAGNOSIS — E559 Vitamin D deficiency, unspecified: Secondary | ICD-10-CM

## 2018-08-15 NOTE — Assessment & Plan Note (Signed)
Monovisc given today.  Warned of potential side effects.  Patient has done relatively well with it in the past.  Discussed icing regimen and home exercise.  Discussed which activities to do which wants to avoid.  Patient is to increase activity slowly over the course the next several days.  Patient will follow-up again 4 weeks.

## 2018-08-15 NOTE — Patient Instructions (Signed)
Good to see you  Ice is your friend Stay active We will get Dexa scan to check for osteoporosis  Also did monovisc today and should do well  See me again in 3 weeks and will get you running

## 2018-08-15 NOTE — Assessment & Plan Note (Signed)
Encouraged him once weekly

## 2018-08-15 NOTE — Assessment & Plan Note (Signed)
Recurrent at this time.  This is difficult.  Patient has had now 2 what appears to be insufficiency fractures as well as patient having the small fracture that seem to be in the knee but responding well to Visco supplementation previously.  History of osteopenia.  DEXA ordered today.  Depending on this we will see how patient responds.  At this moment no running.  Follow-up again in 4 weeks

## 2018-08-16 ENCOUNTER — Other Ambulatory Visit: Payer: Commercial Managed Care - PPO

## 2018-09-04 NOTE — Progress Notes (Signed)
[name] [place], [place] [address] Phone: [phone] Subjective:   Leslie Hughes, am serving as a scribe for Dr. Hulan Saas.   CC: Neck pain, knee pain, ankle pain follow-up  QMG:[id number]   08/15/2018: Recurrent at this time.  This is difficult.  Patient has had now 2 what appears to be insufficiency fractures as well as patient having the small fracture that seem to be in the knee but responding well to Visco supplementation previously.  History of osteopenia.  DEXA ordered today.  Depending on this we will see how patient responds.  At this moment Hughes running.  Follow-up again in 4 weeks  Monovisc given today.  Warned of potential side effects.  Patient has done relatively well with it in the past.  Discussed icing regimen and home exercise.  Discussed which activities to do which wants to avoid.  Patient is to increase activity slowly over the course the next several days.  Patient will follow-up again 4 weeks.  Update 09/05/2018: Leslie Hughes is a 43 y.o. female coming in with complaint of right knee pain. Patient feels like Orthovisc worked better than the [employer] injection.  Only been a month.  Patient has not been active recently. Does have continued pain in ankle mortise but does feel pain is improving.  Patient was found to have a stress reaction.      Past Medical History:  Diagnosis Date  . Allergy   . Cervical dystonia    sees Dr. Tonye Royalty at [facility]   . Degenerative arthritis of right knee 09/10/2015   Started viscous supplementation impression 22nd 2017  . Metrorrhagia    sees Dr. Marylynn Pearson   . Migraine   . Vitamin D deficiency    Past Surgical History:  Procedure Laterality Date  . CESAREAN SECTION  2009  . [place] and 2008   arthroscopy to right knee (tennis injuries)   Social History   Socioeconomic History  . Marital status: Married    Spouse name: Not on file  . Number of  children: Not on file  . Years of education: Not on file  . Highest education level: Not on file  Occupational History  . Not on file  Social Needs  . Financial resource strain: Not on file  . Food insecurity    Worry: Not on file    Inability: Not on file  . Transportation needs    Medical: Not on file    Non-medical: Not on file  Tobacco Use  . Smoking status: Never Smoker  . Smokeless tobacco: Never Used  Substance and Sexual Activity  . Alcohol use: Yes    Alcohol/week: 0.0 standard drinks    Comment: occ  . Drug use: Hughes  . Sexual activity: Not on file  Lifestyle  . Physical activity    Days per week: Not on file    Minutes per session: Not on file  . Stress: Not on file  Relationships  . Social Herbalist on phone: Not on file    Gets together: Not on file    Attends religious service: Not on file    Active member of club or organization: Not on file    Attends meetings of clubs or organizations: Not on file    Relationship status: Not on file  Other Topics Concern  . Not on file  Social History Narrative  . Not on file  Allergies  Allergen Reactions  . Amoxicillin    Family History  Problem Relation Age of Onset  . Hypothyroidism Mother   . Thyroid disease Mother   . Cancer Father        bladder  . Hyperlipidemia Father   . Hypertension Father   . Skin cancer Paternal Grandmother   . Colon cancer Paternal Grandfather     Current Outpatient Medications (Endocrine & Metabolic):  Marland Kitchen  Norethin Ace-Eth Estrad-FE (LOESTRIN FE 1/20 PO), Take by mouth daily. Lo lo estrin    Current Outpatient Medications (Analgesics):  .  meloxicam (MOBIC) 15 MG tablet, Take 1 tablet (15 mg total) by mouth daily. .  rizatriptan (MAXALT) 10 MG tablet, TAKE 1 TABLET BY MOUTH AS NEEDED FOR MIGRAINE, MAY REPEAT ONCE IN 2 HOURS AS NEEDED   Current Outpatient Medications (Other):  Marland Kitchen  Diclofenac Sodium 2 % SOLN, Apply 1 pump twice daily. .  Multiple Vitamin  (MULTIVITAMIN) tablet, Take 1 tablet by mouth daily.   .  Progesterone Micronized (PROGESTERONE, BULK,) POWD, APPLY 4.7SJ (2 CLICKS) TO EACH INNER THIGH DAILY ON DAYS 1-25 OF CYCLE. Marland Kitchen  tiZANidine (ZANAFLEX) 2 MG tablet, TAKE 1 TABLET BY MOUTH EVERY MORNING AND 2 TABLETS EVERY EVENING .  venlafaxine XR (EFFEXOR-XR) 75 MG 24 hr capsule, TAKE 1 CAPSULE BY MOUTH  DAILY WITH BREAKFAST .  Vitamin D, Ergocalciferol, (DRISDOL) 50000 units CAPS capsule, TAKE 1 CAPSULE BY MOUTH  EVERY 7 DAYS    Past medical history, social, surgical and family history all reviewed in electronic medical record.  Hughes pertanent information unless stated regarding to the chief complaint.   Review of Systems:  Hughes headache, visual changes, nausea, vomiting, diarrhea, constipation, dizziness, abdominal pain, skin rash, fevers, chills, night sweats, weight loss, swollen lymph nodes, body aches, joint swelling,  chest pain, shortness of breath, mood changes.  Positive muscle aches  Objective  Blood pressure 102/64, pulse 92, height 5\' 5"  (1.651 m), weight 149 lb (67.6 kg), SpO2 97 %.    General: Hughes apparent distress alert and oriented x3 mood and affect normal, dressed appropriately.  HEENT: Pupils equal, extraocular movements intact  Respiratory: Patient's speak in full sentences and does not appear short of breath  Cardiovascular: Hughes lower extremity edema, non tender, Hughes erythema  Skin: Warm dry intact with Hughes signs of infection or rash on extremities or on axial skeleton.  Abdomen: Soft nontender  Neuro: Cranial nerves II through XII are intact, neurovascularly intact in all extremities with 2+ DTRs and 2+ pulses.  Lymph: Hughes lymphadenopathy of posterior or anterior cervical chain or axillae bilaterally.  Gait normal with good balance and coordination.  MSK:  Non tender with full range of motion and good stability and symmetric strength and tone of shoulders, elbows, wrist, hip, bilaterally.  Patient's right knee is Hughes show  some tenderness to palpation over the medial lateral joint space.  Mild crepitus.  Ankle exam on the left side does show some tenderness to palpation As well.  Patient does have full range of motion of the legs.  Significant improvement from previous exam  Limited musculoskeletal ultrasound was performed and interpreted by Lyndal Pulley  Limited ultrasound of patient's ankle shows Hughes significant bony abnormality or swelling at all at this time.   Impression and Recommendations:     This case required medical decision making of moderate complexity. The above documentation has been reviewed and is accurate and complete Lyndal Pulley, DO  Note: This dictation was prepared with Dragon dictation along with smaller phrase technology. Any transcriptional errors that result from this process are unintentional.

## 2018-09-05 ENCOUNTER — Encounter: Payer: Self-pay | Admitting: Family Medicine

## 2018-09-05 ENCOUNTER — Ambulatory Visit: Payer: Commercial Managed Care - PPO | Admitting: Family Medicine

## 2018-09-05 ENCOUNTER — Other Ambulatory Visit: Payer: Self-pay

## 2018-09-05 ENCOUNTER — Ambulatory Visit: Payer: Self-pay

## 2018-09-05 VITALS — BP 102/64 | HR 92 | Ht 65.0 in | Wt 149.0 lb

## 2018-09-05 DIAGNOSIS — M1711 Unilateral primary osteoarthritis, right knee: Secondary | ICD-10-CM

## 2018-09-05 DIAGNOSIS — G8929 Other chronic pain: Secondary | ICD-10-CM

## 2018-09-05 DIAGNOSIS — M25571 Pain in right ankle and joints of right foot: Secondary | ICD-10-CM

## 2018-09-05 DIAGNOSIS — S86892D Other injury of other muscle(s) and tendon(s) at lower leg level, left leg, subsequent encounter: Secondary | ICD-10-CM | POA: Diagnosis not present

## 2018-09-05 NOTE — Assessment & Plan Note (Signed)
Patient did not think that this completely resolved at this time.  Concerned that patient is not recovering appropriately.  Discussed working out every other day.  Patient has had work-up with a positive ANA but rest of autoimmune work-up has always been negative.  Concern for potentially even a muscle glycogen disorder.  We discussed ordering natural supplementations, working out only every other day for now.  Follow-up again in 4 weeks

## 2018-09-05 NOTE — Assessment & Plan Note (Signed)
Patient has not noted significant improvement with the Monovisc but I am thinking patient may still show some improvement over the next 2 weeks.  Discussed icing regimen and home exercises.  Which activities of doing which wants to avoid.  Continue topical anti-inflammatories and bracing.  Follow-up again in 4 to 6 weeks

## 2018-09-05 NOTE — Patient Instructions (Addendum)
L-arginine 1000 mg Continue amino acidds CoQ10 100mg  post-workout Workout every other day for a total of 45 minutes  See me again in 4 weeks

## 2018-10-03 ENCOUNTER — Encounter: Payer: Self-pay | Admitting: Family Medicine

## 2018-10-03 ENCOUNTER — Ambulatory Visit (INDEPENDENT_AMBULATORY_CARE_PROVIDER_SITE_OTHER): Payer: Commercial Managed Care - PPO | Admitting: Family Medicine

## 2018-10-03 ENCOUNTER — Other Ambulatory Visit: Payer: Self-pay

## 2018-10-03 ENCOUNTER — Encounter: Payer: Self-pay | Admitting: Neurology

## 2018-10-03 VITALS — BP 100/80 | HR 94 | Ht 65.0 in | Wt 149.0 lb

## 2018-10-03 DIAGNOSIS — M2550 Pain in unspecified joint: Secondary | ICD-10-CM

## 2018-10-03 DIAGNOSIS — G243 Spasmodic torticollis: Secondary | ICD-10-CM | POA: Diagnosis not present

## 2018-10-03 DIAGNOSIS — M999 Biomechanical lesion, unspecified: Secondary | ICD-10-CM | POA: Diagnosis not present

## 2018-10-03 MED ORDER — VITAMIN D (ERGOCALCIFEROL) 1.25 MG (50000 UNIT) PO CAPS: 50000.0000 [IU] | ORAL_CAPSULE | ORAL | 0 refills | Status: DC

## 2018-10-03 NOTE — Patient Instructions (Signed)
Good to see you [place] carbon x Compression sleeve with running and after activity  CoQ10 for a week then send me a message See me again in 2 months

## 2018-10-03 NOTE — Assessment & Plan Note (Signed)
Decision today to treat with OMT was based on Physical Exam  After verbal consent patient was treated with HVLA, ME, FPR techniques in cervical, thoracic, rib areas  Patient tolerated the procedure well with improvement in symptoms  Patient given exercises, stretches and lifestyle modifications  See medications in patient instructions if given  Patient will follow up in 4-6 weeks 

## 2018-10-03 NOTE — Assessment & Plan Note (Addendum)
Patient has had nerve conduction studies previously.  These were abnormal but patient never got any true guidance of what to do next.  Was responded somewhat to Botox previously.  Patient had some mild improvement with it but never was completely resolved.  I am concerned that there could be a underlying cause such as a muscular tendon juncture pathology, multiple sclerosis, or other neurologic pathology that could be contributing as well.  Like to refer patient to neurology.  Patient is also had many different injuries that seems to be out of proportion if continuing to have difficulty may need to consider genetics for more of a muscle glycogen storage disease.  Patient continue with conservative therapy otherwise and can work out as tolerated.  Follow-up with me again in 4 to 6 weeks.  Spent  25 minutes with patient face-to-face and had greater than 50% of counseling including as described above in assessment and plan.

## 2018-10-03 NOTE — Progress Notes (Signed)
[facility] [address] [place], [place] [address] Phone: [phone] Subjective:    I'm seeing this patient by the request  of:    CC: Ankle pain and other pain follow-up  BLT:[id number]   09/05/2018 Patient did not think that this completely resolved at this time.  Concerned that patient is not recovering appropriately.  Discussed working out every other day.  Patient has had work-up with a positive ANA but rest of autoimmune work-up has always been negative.  Concern for potentially even a muscle glycogen disorder.  We discussed ordering natural supplementations, working out only every other day for now.  Follow-up again in 4 weeks 10/03/2018 Leslie Hughes is a 42 y.o. female coming in with complaint of ankle pain. States that her ankle is doing fair. Hasn't been running but is walking instead. Would like a Vitamin D refill.  Patient continues to have aches and pains that seems to be out of proportion.  Patient states that it just seems to have more difficulty with recovery.  Patient states that the co-Q10 caused headaches so has not been taking them.  Patient feels that the amino acids has helped somewhat.     Past Medical History:  Diagnosis Date  . Allergy   . Cervical dystonia    sees Dr. Tonye Royalty at [facility]   . Degenerative arthritis of right knee 09/10/2015   Started viscous supplementation impression 22nd 2017  . Metrorrhagia    sees Dr. Marylynn Pearson   . Migraine   . Vitamin D deficiency    Past Surgical History:  Procedure Laterality Date  . CESAREAN SECTION  2009  . [place] and 2008   arthroscopy to right knee (tennis injuries)   Social History   Socioeconomic History  . Marital status: Married    Spouse name: Not on file  . Number of children: Not on file  . Years of education: Not on file  . Highest education level: Not on file  Occupational History  . Not on file  Social Needs  . Financial resource strain:  Not on file  . Food insecurity    Worry: Not on file    Inability: Not on file  . Transportation needs    Medical: Not on file    Non-medical: Not on file  Tobacco Use  . Smoking status: Never Smoker  . Smokeless tobacco: Never Used  Substance and Sexual Activity  . Alcohol use: Yes    Alcohol/week: 0.0 standard drinks    Comment: occ  . Drug use: No  . Sexual activity: Not on file  Lifestyle  . Physical activity    Days per week: Not on file    Minutes per session: Not on file  . Stress: Not on file  Relationships  . Social Herbalist on phone: Not on file    Gets together: Not on file    Attends religious service: Not on file    Active member of club or organization: Not on file    Attends meetings of clubs or organizations: Not on file    Relationship status: Not on file  Other Topics Concern  . Not on file  Social History Narrative  . Not on file   Allergies  Allergen Reactions  . Amoxicillin    Family History  Problem Relation Age of Onset  . Hypothyroidism Mother   . Thyroid disease Mother   . Cancer Father  bladder  . Hyperlipidemia Father   . Hypertension Father   . Skin cancer Paternal Grandmother   . Colon cancer Paternal Grandfather     Current Outpatient Medications (Endocrine & Metabolic):  Marland Kitchen  Norethin Ace-Eth Estrad-FE (LOESTRIN FE 1/20 PO), Take by mouth daily. Lo lo estrin    Current Outpatient Medications (Analgesics):  .  meloxicam (MOBIC) 15 MG tablet, Take 1 tablet (15 mg total) by mouth daily. .  rizatriptan (MAXALT) 10 MG tablet, TAKE 1 TABLET BY MOUTH AS NEEDED FOR MIGRAINE, MAY REPEAT ONCE IN 2 HOURS AS NEEDED   Current Outpatient Medications (Other):  Marland Kitchen  Diclofenac Sodium 2 % SOLN, Apply 1 pump twice daily. .  Multiple Vitamin (MULTIVITAMIN) tablet, Take 1 tablet by mouth daily.   .  Progesterone Micronized (PROGESTERONE, BULK,) POWD, APPLY 0.8XK (2 CLICKS) TO EACH INNER THIGH DAILY ON DAYS 1-25 OF CYCLE. Marland Kitchen   tiZANidine (ZANAFLEX) 2 MG tablet, TAKE 1 TABLET BY MOUTH EVERY MORNING AND 2 TABLETS EVERY EVENING .  venlafaxine XR (EFFEXOR-XR) 75 MG 24 hr capsule, TAKE 1 CAPSULE BY MOUTH  DAILY WITH BREAKFAST .  Vitamin D, Ergocalciferol, (DRISDOL) 50000 units CAPS capsule, TAKE 1 CAPSULE BY MOUTH  EVERY 7 DAYS .  Vitamin D, Ergocalciferol, (DRISDOL) 1.25 MG (50000 UT) CAPS capsule, Take 1 capsule (50,000 Units total) by mouth every 7 (seven) days.    Past medical history, social, surgical and family history all reviewed in electronic medical record.  No pertanent information unless stated regarding to the chief complaint.   Review of Systems:  No headache, visual changes, nausea, vomiting, diarrhea, constipation, dizziness, abdominal pain, skin rash, fevers, chills, night sweats, weight loss, swollen lymph nodes, body aches, joint swelling, chest pain, shortness of breath, mood changes.  Positive muscle aches  Objective  Blood pressure 100/80, pulse 94, height 5\' 5"  (4.818 m), weight 149 lb (67.6 kg), SpO2 98 %.    General: No apparent distress alert and oriented x3 mood and affect normal, dressed appropriately.  HEENT: Pupils equal, extraocular movements intact  Respiratory: Patient's speak in full sentences and does not appear short of breath  Cardiovascular: No lower extremity edema, non tender, no erythema  Skin: Warm dry intact with no signs of infection or rash on extremities or on axial skeleton.  Abdomen: Soft nontender  Neuro: Cranial nerves II through XII are intact, neurovascularly intact in all extremities with 2+ DTRs and 2+ pulses.  Lymph: No lymphadenopathy of posterior or anterior cervical chain or axillae bilaterally.  Gait normal with good balance and coordination.  MSK:  tender with near full range of motion  and good stability and symmetric strength and tone of shoulders, elbows, wrist, hip, knee and bilaterally.   Neck exam shows significant tightness overall.  Patient has had  some decrease in range of motion.  Tightness of the scapular region bilaterally more than usual as well.  Negative Spurling's.  No radicular symptoms in the extremities at this time.  Still very mild weakness noted in the C6 distribution on the right side.      Impression and Recommendations:     This case required medical decision making of moderate complexity. The above documentation has been reviewed and is accurate and complete Lyndal Pulley, DO       Note: This dictation was prepared with Dragon dictation along with smaller phrase technology. Any transcriptional errors that result from this process are unintentional.

## 2018-10-16 ENCOUNTER — Other Ambulatory Visit: Payer: Self-pay | Admitting: Family Medicine

## 2018-11-13 ENCOUNTER — Other Ambulatory Visit: Payer: Self-pay | Admitting: Family Medicine

## 2018-11-26 NOTE — Progress Notes (Signed)
[name] [address] [place], [place] [address] Phone: [phone] Subjective:   Leslie Hughes, am serving as a scribe for Dr. Hulan Saas.     CC: Left ankle pain follow-up , neck pain follow-up  [id number]   10/03/2018 Patient has had nerve conduction studies previously.  These were abnormal but patient never got any true guidance of what to do next.  Was responded somewhat to Botox previously.  Patient had some mild improvement with it but never was completely resolved.  I am concerned that there could be a underlying cause such as a muscular tendon juncture pathology, multiple sclerosis, or other neurologic pathology that could be contributing as well.  Like to refer patient to neurology.  Patient is also had many different injuries that seems to be out of proportion if continuing to have difficulty may need to consider genetics for more of a muscle glycogen storage disease.  Patient continue with conservative therapy otherwise and can work out as tolerated.  Follow-up with me again in 4 to 6 weeks.  Spent  25 minutes with patient face-to-face and had greater than 50% of counseling including as described above in assessment and plan.  Update 11/27/2018 Leslie Hughes is a 43 y.o. female coming in with complaint of continues to have left ankle pain with walking and playing tennis. Would like to get back to running. Pain feels like a "shin splint." Is worried that sensation that she has is a stress fracture. Is resting 2 days in between activity. Is using amino acids.   Neck pain is the same as last visit. Has not seen neurologist next week.  Patient states then having some increasing some mild discomfort and maybe even some headaches from time to time.  Some increasing stress recently that could be contributing as well.  Patient also thinks that the lack of increasing activity has become more difficult.     Past Medical History:  Diagnosis Date  .  Allergy   . Cervical dystonia    sees Dr. Tonye Royalty at [facility]   . Degenerative arthritis of right knee 09/10/2015   Started viscous supplementation impression 22nd 2017  . Metrorrhagia    sees Dr. Marylynn Pearson   . Migraine   . Vitamin D deficiency    Past Surgical History:  Procedure Laterality Date  . CESAREAN SECTION  2009  . [place] and 2008   arthroscopy to right knee (tennis injuries)   Social History   Socioeconomic History  . Marital status: Married    Spouse name: Not on file  . Number of children: Not on file  . Years of education: Not on file  . Highest education level: Not on file  Occupational History  . Not on file  Social Needs  . Financial resource strain: Not on file  . Food insecurity    Worry: Not on file    Inability: Not on file  . Transportation needs    Medical: Not on file    Non-medical: Not on file  Tobacco Use  . Smoking status: Never Smoker  . Smokeless tobacco: Never Used  Substance and Sexual Activity  . Alcohol use: Yes    Alcohol/week: 0.0 standard drinks    Comment: occ  . Drug use: Hughes  . Sexual activity: Not on file  Lifestyle  . Physical activity    Days per week: Not on file    Minutes per session: Not on file  .  Stress: Not on file  Relationships  . Social Herbalist on phone: Not on file    Gets together: Not on file    Attends religious service: Not on file    Active member of club or organization: Not on file    Attends meetings of clubs or organizations: Not on file    Relationship status: Not on file  Other Topics Concern  . Not on file  Social History Narrative  . Not on file   Allergies  Allergen Reactions  . Amoxicillin    Family History  Problem Relation Age of Onset  . Hypothyroidism Mother   . Thyroid disease Mother   . Cancer Father        bladder  . Hyperlipidemia Father   . Hypertension Father   . Skin cancer Paternal Grandmother   . Colon cancer Paternal Grandfather      Current Outpatient Medications (Endocrine & Metabolic):  Marland Kitchen  Norethin Ace-Eth Estrad-FE (LOESTRIN FE 1/20 PO), Take by mouth daily. Lo lo estrin    Current Outpatient Medications (Analgesics):  .  meloxicam (MOBIC) 15 MG tablet, Take 1 tablet (15 mg total) by mouth daily. .  rizatriptan (MAXALT) 10 MG tablet, TAKE 1 TABLET BY MOUTH AS NEEDED FOR MIGRAINE, MAY REPEAT ONCE IN 2 HOURS AS NEEDED   Current Outpatient Medications (Other):  Marland Kitchen  Diclofenac Sodium 2 % SOLN, Apply 1 pump twice daily. .  Multiple Vitamin (MULTIVITAMIN) tablet, Take 1 tablet by mouth daily.   .  Progesterone Micronized (PROGESTERONE, BULK,) POWD, APPLY [id number] (2 CLICKS) TO EACH INNER THIGH DAILY ON DAYS 1-25 OF CYCLE. Marland Kitchen  tiZANidine (ZANAFLEX) 2 MG tablet, TAKE 1 TABLET BY MOUTH EVERY MORNING AND 2 TABLETS EVERY EVENING .  venlafaxine XR (EFFEXOR-XR) 75 MG 24 hr capsule, TAKE 1 CAPSULE BY MOUTH  DAILY WITH BREAKFAST .  Vitamin D, Ergocalciferol, (DRISDOL) 1.25 MG (50000 UT) CAPS capsule, Take 1 capsule (50,000 Units total) by mouth every 7 (seven) days. .  Vitamin D, Ergocalciferol, (DRISDOL) 1.25 MG (50000 UT) CAPS capsule, Take one capsule by mouth every 7 days.    Past medical history, social, surgical and family history all reviewed in electronic medical record.  Hughes pertanent information unless stated regarding to the chief complaint.   Review of Systems:  Hughes  visual changes, nausea, vomiting, diarrhea, constipation, dizziness, abdominal pain, skin rash, fevers, chills, night sweats, weight loss, swollen lymph nodes, body aches, joint swelling,chest pain, shortness of breath, mood changes.  Positive muscle aches mild headaches  Objective  Blood pressure 110/74, pulse (!) 101, height 5\' 5"  (1.651 m), weight 139 lb (63 kg), SpO2 98 %.    General: Hughes apparent distress alert and oriented x3 mood and affect normal, dressed appropriately.  HEENT: Pupils equal, extraocular movements intact  Respiratory: Patient's  speak in full sentences and does not appear short of breath  Cardiovascular: Hughes lower extremity edema, non tender, Hughes erythema  Skin: Warm dry intact with Hughes signs of infection or rash on extremities or on axial skeleton.  Abdomen: Soft nontender  Neuro: Cranial nerves II through XII are intact, neurovascularly intact in all extremities with 2+ DTRs and 2+ pulses.  Lymph: Hughes lymphadenopathy of posterior or anterior cervical chain or axillae bilaterally.  Gait normal with good balance and coordination.  MSK:  Non tender with full range of motion and good stability and symmetric strength and tone of shoulders, elbows, wrist, hip, knee and ankles bilaterally.  Neck: Inspection  mild loss of lordosis. Hughes palpable stepoffs. Negative Spurling's maneuver.  Grip strength and sensation normal in bilateral hands Strength good C4 to T1 distribution Hughes sensory change to C4 to T1 Negative Hoffman sign bilaterally Reflexes normal Tightness of the occipital region left greater than right.  Osteopathic findings  C2 flexed rotated and side bent right C7 flexed rotated and side bent left T3 extended rotated and side bent right inhaled third rib T8 extended rotated and side bent left     Impression and Recommendations:     This case required medical decision making of moderate complexity. The above documentation has been reviewed and is accurate and complete Lyndal Pulley, DO       Note: This dictation was prepared with Dragon dictation along with smaller phrase technology. Any transcriptional errors that result from this process are unintentional.

## 2018-11-27 ENCOUNTER — Encounter: Payer: Self-pay | Admitting: Family Medicine

## 2018-11-27 ENCOUNTER — Other Ambulatory Visit: Payer: Self-pay

## 2018-11-27 ENCOUNTER — Ambulatory Visit (INDEPENDENT_AMBULATORY_CARE_PROVIDER_SITE_OTHER): Payer: Commercial Managed Care - PPO | Admitting: Family Medicine

## 2018-11-27 ENCOUNTER — Ambulatory Visit: Payer: Self-pay

## 2018-11-27 VITALS — BP 110/74 | HR 101 | Ht 65.0 in | Wt 139.0 lb

## 2018-11-27 DIAGNOSIS — G8929 Other chronic pain: Secondary | ICD-10-CM

## 2018-11-27 DIAGNOSIS — M999 Biomechanical lesion, unspecified: Secondary | ICD-10-CM

## 2018-11-27 DIAGNOSIS — G243 Spasmodic torticollis: Secondary | ICD-10-CM

## 2018-11-27 DIAGNOSIS — M25572 Pain in left ankle and joints of left foot: Secondary | ICD-10-CM | POA: Diagnosis not present

## 2018-11-27 MED ORDER — VITAMIN D (ERGOCALCIFEROL) 1.25 MG (50000 UNIT) PO CAPS: ORAL_CAPSULE | ORAL | 0 refills | Status: AC

## 2018-11-27 NOTE — Assessment & Plan Note (Signed)
Decision today to treat with OMT was based on Physical Exam  After verbal consent patient was treated with HVLA, ME, FPR techniques in cervical, thoracic, rib areas  Patient tolerated the procedure well with improvement in symptoms  Patient given exercises, stretches and lifestyle modifications  See medications in patient instructions if given  Patient will follow up in 4-6 weeks 

## 2018-11-27 NOTE — Patient Instructions (Signed)
Good to see you Go to town on the ankle Read about PRP Follow up in 6-8 weeks for the back

## 2018-11-27 NOTE — Assessment & Plan Note (Signed)
History of dystonia but improvement in range of motion at this moment.  Following up with neurology that may be beneficial as well.  Patient does respond fairly well to osteopathic manipulation.  Started home exercises icing regimen.  Follow-up again with me in 4 to 6 weeks

## 2018-11-29 ENCOUNTER — Encounter: Payer: Self-pay | Admitting: Neurology

## 2018-12-03 ENCOUNTER — Ambulatory Visit (INDEPENDENT_AMBULATORY_CARE_PROVIDER_SITE_OTHER): Payer: Commercial Managed Care - PPO | Admitting: Neurology

## 2018-12-03 DIAGNOSIS — Z9119 Patient's noncompliance with other medical treatment and regimen: Secondary | ICD-10-CM

## 2018-12-04 NOTE — Progress Notes (Signed)
Rescheduled

## 2018-12-16 ENCOUNTER — Other Ambulatory Visit: Payer: Self-pay | Admitting: Family Medicine

## 2018-12-26 IMAGING — MR MR CERVICAL SPINE W/O CM
5 series · 30 of 48 positions shown · non-contrast
Comparison: Radiography 05/01/2017 neck CT 08/27/2014.

CLINICAL DATA: Neck pain and posterior neck swelling over the last
6 months.

EXAM:
MRI CERVICAL SPINE WITHOUT CONTRAST
TECHNIQUE: Multiplanar, multisequence MR imaging of the cervical spine was
performed. No intravenous contrast was administered.

[Series 2: T2 · sagittal · 3.0mm · 0.41mm/px · 6 of 15 slices shown (1 of 2)]
[im 1/15]
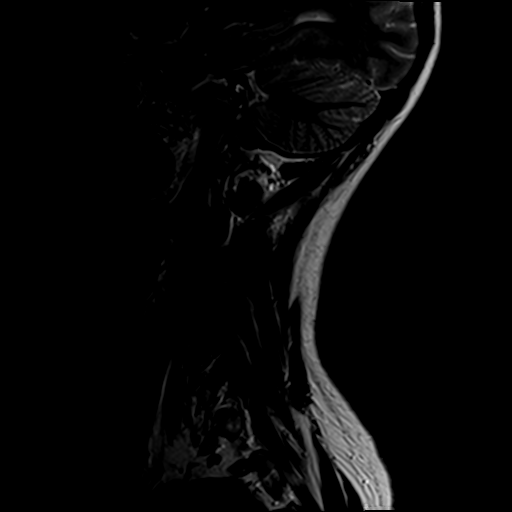
[im 3/15]
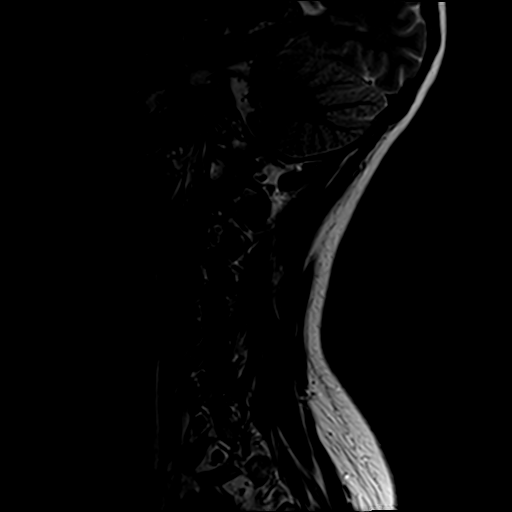
[im 6/15]
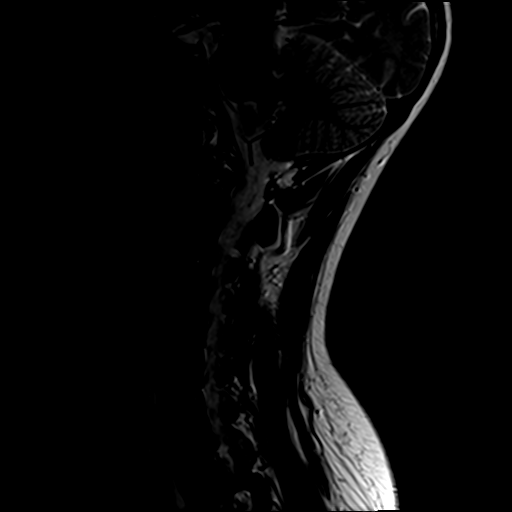
[im 9/15]
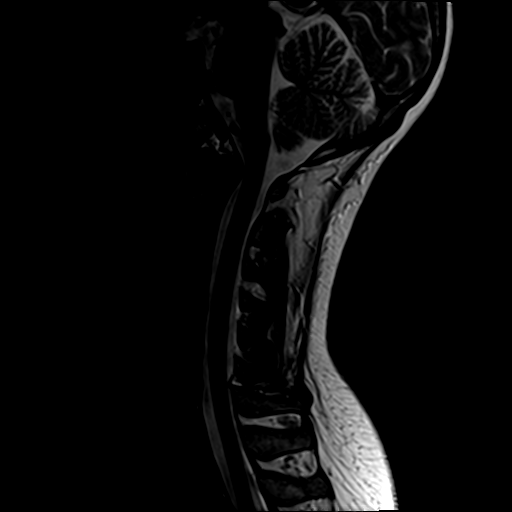
[im 12/15]
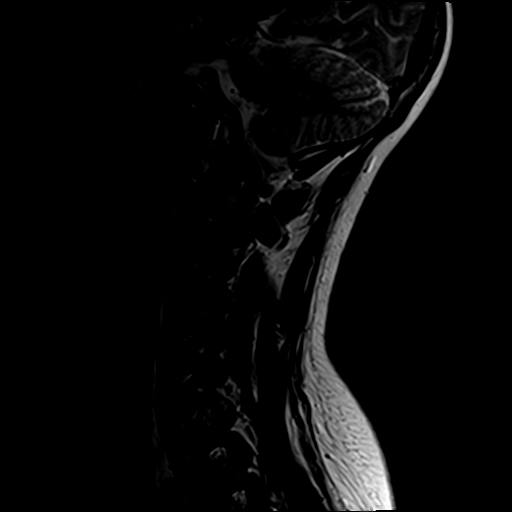
[im 15/15]
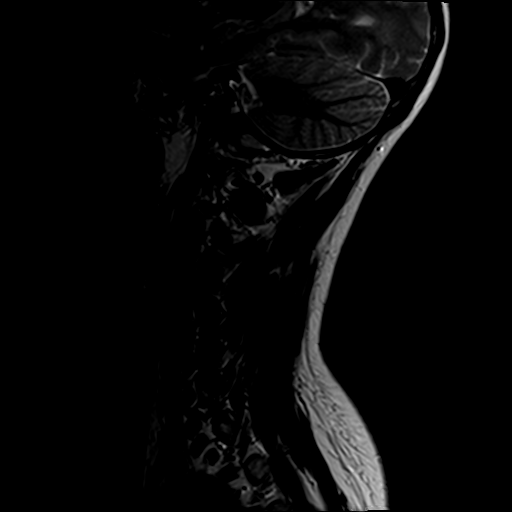

[Series 3: STIR · sagittal · 3.0mm · 0.82mm/px · 7 of 15 slices shown]
[im 1/15]
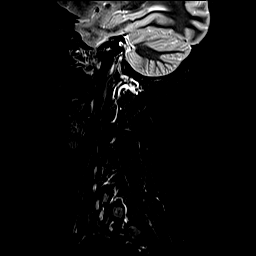
[im 3/15]
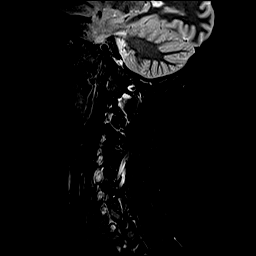
[im 5/15]
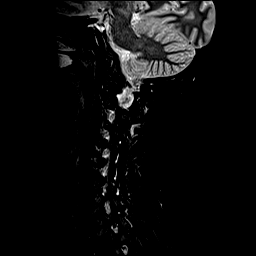
[im 8/15]
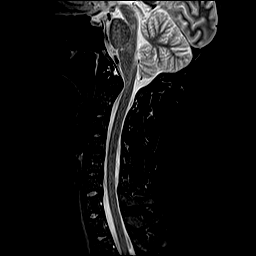
[im 10/15]
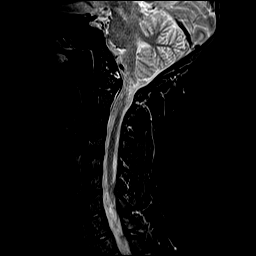
[im 12/15]
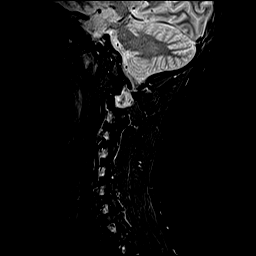
[im 15/15]
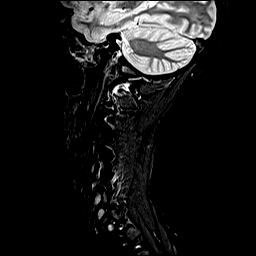

[Series 4: T1 · sagittal · 3.0mm · 0.41mm/px · 7 of 15 slices shown]
[im 1/15]
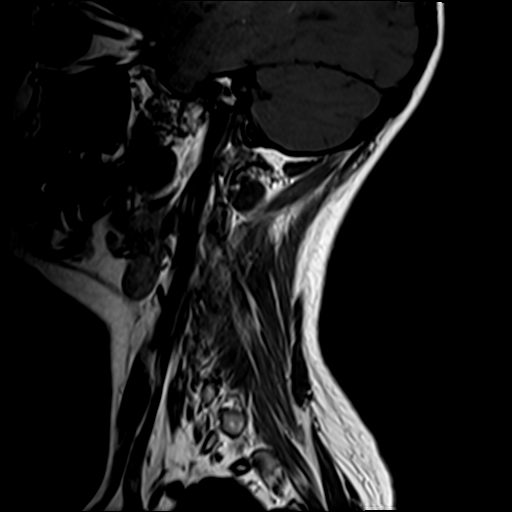
[im 3/15]
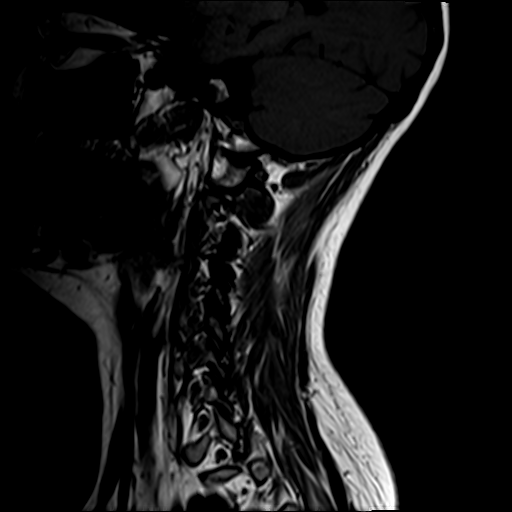
[im 5/15]
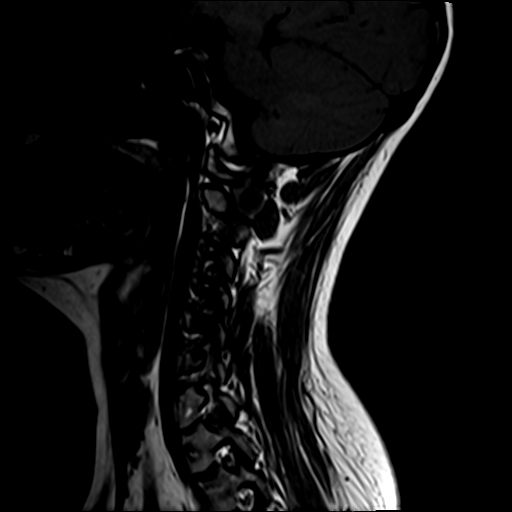
[im 8/15]
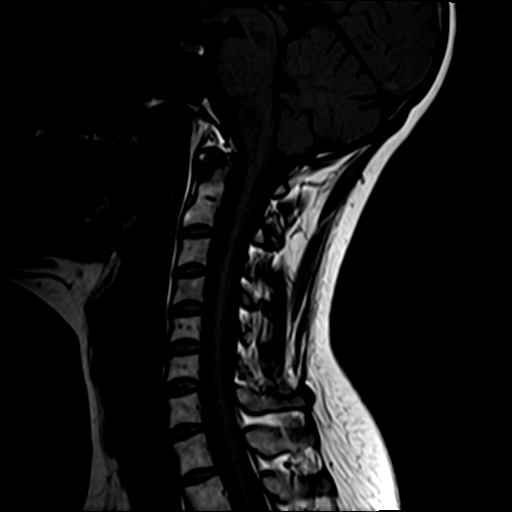
[im 10/15]
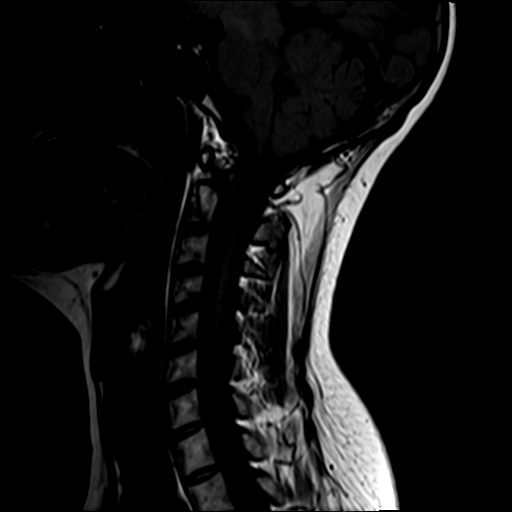
[im 12/15]
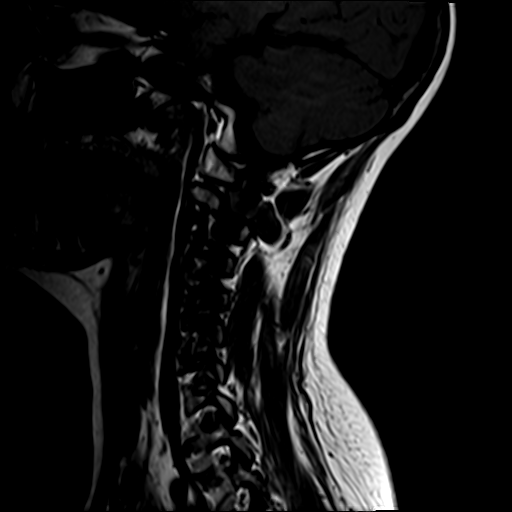
[im 15/15]
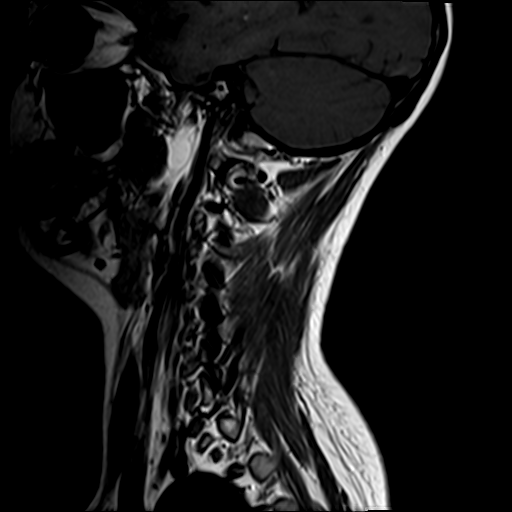

[Series 5: T2 · axial · 3.0mm · 0.70mm/px · z∈[-147,-39]mm · 8 of 30 slices shown (2 of 2)]
[im 1/30]
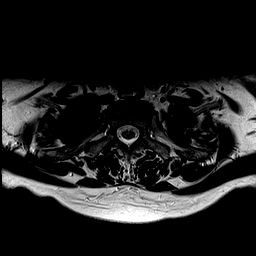
[im 5/30]
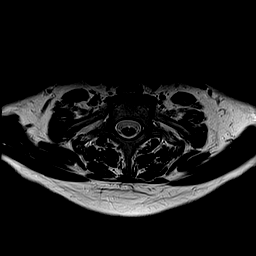
[im 9/30]
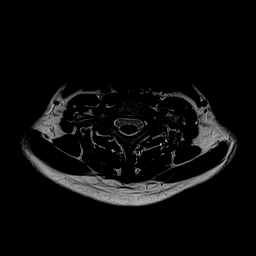
[im 14/30]
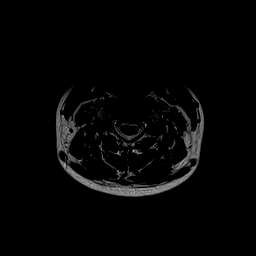
[im 16/30]
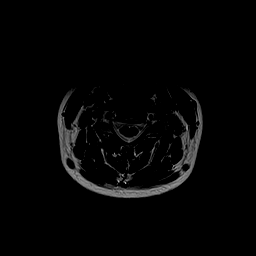
[im 21/30]
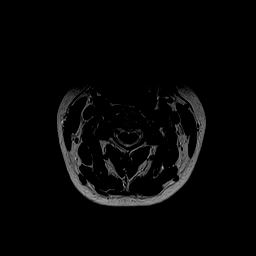
[im 25/30]
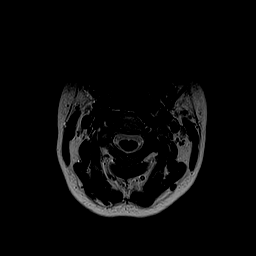
[im 30/30]
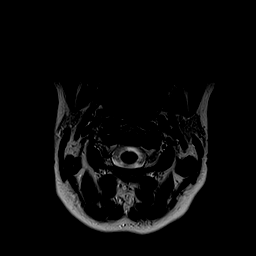

[Series 6: GRE · axial · 3.0mm · 0.35mm/px · z∈[-147,-132]mm · 2 of 30 slices shown]
[im 1/30]
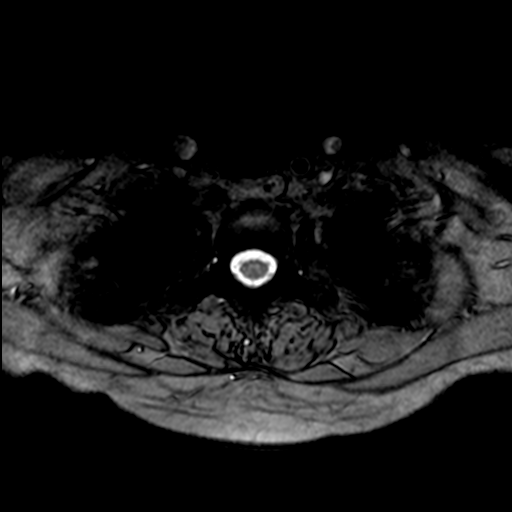
[im 5/30]
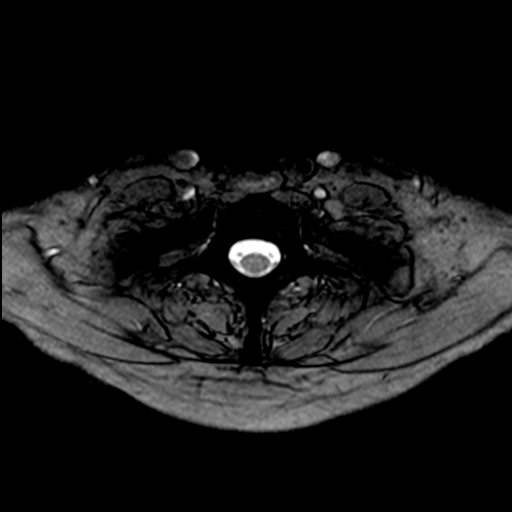

[30 of 48 positions shown; findings below may reference images not displayed]

FINDINGS: Alignment: Normal

Vertebrae: Normal

Cord: Normal

Posterior Fossa, vertebral arteries, paraspinal tissues: Normal. No
neck mass or edema identified.

Disc levels:

Normal. No degenerative changes. No stenosis of the canal or
foramina. No facet arthropathy.
IMPRESSION: Normal MRI of the cervical spine and posterior neck region.

## 2018-12-28 ENCOUNTER — Ambulatory Visit: Payer: Commercial Managed Care - PPO | Admitting: Neurology

## 2018-12-28 ENCOUNTER — Encounter: Payer: Self-pay | Admitting: Neurology

## 2018-12-28 ENCOUNTER — Other Ambulatory Visit: Payer: Self-pay

## 2018-12-28 VITALS — BP 110/84 | HR 74 | Ht 69.0 in | Wt 149.0 lb

## 2018-12-28 DIAGNOSIS — M79604 Pain in right leg: Secondary | ICD-10-CM

## 2018-12-28 DIAGNOSIS — M79601 Pain in right arm: Secondary | ICD-10-CM | POA: Diagnosis not present

## 2018-12-28 NOTE — Patient Instructions (Signed)
Nerve testing of the right arm and leg.  Do not apply lotion or moisturizer on the day of testing  ELECTROMYOGRAM AND NERVE CONDUCTION STUDIES (EMG/NCS) INSTRUCTIONS  How to Prepare The neurologist conducting the EMG will need to know if you have certain medical conditions. Tell the neurologist and other EMG lab personnel if you: . Have a pacemaker or any other electrical medical device . Take blood-thinning medications . Have hemophilia, a blood-clotting disorder that causes prolonged bleeding Bathing Take a shower or bath shortly before your exam in order to remove oils from your skin. Don't apply lotions or creams before the exam.  What to Expect You'll likely be asked to change into a hospital gown for the procedure and lie down on an examination table. The following explanations can help you understand what will happen during the exam.  . Electrodes. The neurologist or a technician places surface electrodes at various locations on your skin depending on where you're experiencing symptoms. Or the neurologist may insert needle electrodes at different sites depending on your symptoms.  . Sensations. The electrodes will at times transmit a tiny electrical current that you may feel as a twinge or spasm. The needle electrode may cause discomfort or pain that usually ends shortly after the needle is removed. If you are concerned about discomfort or pain, you may want to talk to the neurologist about taking a short break during the exam.  . Instructions. During the needle EMG, the neurologist will assess whether there is any spontaneous electrical activity when the muscle is at rest - activity that isn't present in healthy muscle tissue - and the degree of activity when you slightly contract the muscle.  He or she will give you instructions on resting and contracting a muscle at appropriate times. Depending on what muscles and nerves the neurologist is examining, he or she may ask you to change  positions during the exam.  After your EMG You may experience some temporary, minor bruising where the needle electrode was inserted into your muscle. This bruising should fade within several days. If it persists, contact your primary care doctor.

## 2018-12-31 NOTE — Progress Notes (Signed)
[facility] Clinic Note - Initial Visit   Date: 12/28/18  Leslie Hughes MRN: [id number] DOB: 04/09/1975   Dear Dr. Katrinka Hughes:  Thank you for your kind referral of Leslie Hughes for consultation of polyarthralgia arm pain. Although her history is well known to you, please allow [place] to reiterate it for the purpose of our medical record. The patient was accompanied to the clinic by self.   History of Present Illness: Leslie Hughes is a 43 y.o. right-handed female referred for my opinion on whether a neuromuscular condition can explain a multitude of her symptoms over the past several years.  She reports having history of unexplained hives, polyarthralgia, bilateral ankle tendon tears, right rectus femoris muscle tear, the latter which were with mild injury, while playing tennis. Extensive autoimmune work-up has showed mildly positive ANA, and otherwise negative.   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 She stays is very active socially and physically.  She denies any muscle fatigue or atrophy.  She reports generalized muscle cramps, usually in the morning.  She takes Zanaflex 2 mg at bedtime which helps.  No problems swallowing, talking, double vision, droopiness of the eyelids.  She has no family history of neuromuscular disease.  She has previously seen dermatology, rheumatology, neurology and sports medicine without clear diagnosis.  She has also had extensive evaluation at [facility] for right cervical dystonia.  See below for treatment and work-up as per Dr. Harriett Rush last clinic note dated 07/19/2012.  At this time, she has decided not to treat cervical dystonia with medications and has been managing symptoms with stretching and yoga.  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  MRI cervical  spine from 2012 was normal.  Treatment trials include:  Botulinum toxin by a neurologist in [place] and also by Dr. Carole Binning. Received Botox for one year but did not find it helpful.  Botulinum toxin by Dr. Marya Fossa 03/06/12 (first injection )  Trigger point injections at the base of the neck for occipital neuralgia (Dr. Estella Husk). Some relief, consideralble soreness.  Neurontin, and another medication in the same class, but both made her anxious and loopy.  Zanaflex in the evening, which helps her sleep.   Trihexyphenidyl - Titrated to 4mg  BID. Discontinued due to dry mouth. Unclear if there was benefit.  Sinemet 25/100mg  - unclear if there was benefit at 1 tablet TID. Made her feel "foggy-headed".  Cymbalta - caused agitation  Progesterone - for migraines. Her migraines spiked post pregnancy and she was found to have low progesterone levels.  Massage  She sees a chiropractor who does active release therapy once per week.  Lortab - following knee surgery made her throw up violently     Out-side paper records, electronic medical record, and images have been reviewed where available and summarized as:  MRI cervical spine wo contrast 09/27/2017:  Normal  EMG of her neck (Dr. Carole Binning) - reportedly normal other than bilateral carpal tunnel syndrome  MRI cervical spine - [place], 07/12/10. Normal (report reviewed)  MRI brain -. Normal. Anti-GAD antibodies normal  24 hr urine copper, serum copper, ceruloplasmin: normal DYT1: Negative DYT6: Negative Serum Paraneoplastic: Negative    Past Medical History:  Diagnosis Date   Allergy    Cervical dystonia    sees Dr. Zola Button at [facility]    Degenerative arthritis of right knee 09/10/2015   Started  viscous supplementation impression 22nd 2017   Metrorrhagia    sees Dr. Zelphia Cairo    Migraine    Vitamin D deficiency     Past Surgical History:  Procedure Laterality Date   CESAREAN SECTION  2009   KNEE SURGERY  1999 and  2008   arthroscopy to right knee (tennis injuries)     Medications:  Outpatient Encounter Medications as of 12/28/2018  Medication Sig   Diclofenac Sodium 2 % SOLN Apply 1 pump twice daily.   meloxicam (MOBIC) 15 MG tablet Take 1 tablet (15 mg total) by mouth daily.   Multiple Vitamin (MULTIVITAMIN) tablet Take 1 tablet by mouth daily.     Norethin Ace-Eth Estrad-FE (LOESTRIN FE 1/20 PO) Take by mouth daily. Lo lo estrin   Progesterone Micronized (PROGESTERONE, BULK,) POWD APPLY 0.5ML (2 CLICKS) TO EACH INNER THIGH DAILY ON DAYS 1-25 OF CYCLE.   rizatriptan (MAXALT) 10 MG tablet TAKE 1 TABLET BY MOUTH AS NEEDED FOR MIGRAINE, MAY REPEAT ONCE IN 2 HOURS AS NEEDED   tiZANidine (ZANAFLEX) 2 MG tablet TAKE 1 TABLET BY MOUTH EVERY MORNING AND 2 TABLETS EVERY EVENING   venlafaxine XR (EFFEXOR-XR) 75 MG 24 hr capsule TAKE 1 CAPSULE BY MOUTH  DAILY WITH BREAKFAST   Vitamin D, Ergocalciferol, (DRISDOL) 1.25 MG (50000 UT) CAPS capsule Take 1 capsule (50,000 Units total) by mouth every 7 (seven) days.   Vitamin D, Ergocalciferol, (DRISDOL) 1.25 MG (50000 UT) CAPS capsule Take one capsule by mouth every 7 days.   No facility-administered encounter medications on file as of 12/28/2018.     Allergies:  Allergies  Allergen Reactions   Amoxicillin Rash   Amoxicillin     Family History: Family History  Problem Relation Age of Onset   Hypothyroidism Mother    Thyroid disease Mother    Cancer Father        bladder   Hyperlipidemia Father    Hypertension Father    Skin cancer Paternal Grandmother    Colon cancer Paternal Grandfather     Social History: Social History   Tobacco Use   Smoking status: Never Smoker   Smokeless tobacco: Never Used  Substance Use Topics   Alcohol use: Yes    Alcohol/week: 0.0 standard drinks    Comment: occ   Drug use: No   Social History   Social History Narrative   Right handed   Two story home   College grad    Review of  Systems:  CONSTITUTIONAL: No fevers, chills, night sweats, or weight loss.   EYES: No visual changes or eye pain ENT: No hearing changes.  No history of nose bleeds.   RESPIRATORY: No cough, wheezing and shortness of breath.   CARDIOVASCULAR: Negative for chest pain, and palpitations.   GI: Negative for abdominal discomfort, blood in stools or black stools.  No recent change in bowel habits.   GU:  No history of incontinence.   MUSCLOSKELETAL: +history of joint pain +swelling.  +myalgias.   SKIN: Negative for lesions, rash, and itching.   HEMATOLOGY/ONCOLOGY: Negative for prolonged bleeding, bruising easily, and swollen nodes.  No history of cancer.   ENDOCRINE: Negative for cold or heat intolerance, polydipsia or goiter.   PSYCH:  No depression or anxiety symptoms.   NEURO: As Above.   Vital Signs:  BP 110/84    Pulse 74    Ht 5\' 9"  (1.753 m)    Wt 149 lb (67.6 kg)    SpO2 98%  BMI 22.00 kg/m    General Medical Exam:   General:  Well appearing, comfortable.   Eyes/ENT: see cranial nerve examination.   Neck:   No carotid bruits. Respiratory:  Clear to auscultation, good air entry bilaterally.   Cardiac:  Regular rate and rhythm, no murmur.   Extremities:  No deformities, edema, or skin discoloration.  Skin:  No rashes or lesions.  Neurological Exam: MENTAL STATUS including orientation to time, place, person, recent and remote memory, attention span and concentration, language, and fund of knowledge is normal.  Speech is not dysarthric.  CRANIAL NERVES: II:  No visual field defects.   III-IV-VI: Pupils equal round and reactive to light.  Normal conjugate, extra-ocular eye movements in all directions of gaze.  No nystagmus.  No ptosis.   V:  Normal facial sensation.    VII:  Normal facial symmetry and movements.   VIII:  Normal hearing and vestibular function.   IX-X:  Normal palatal movement.   XI:  Normal shoulder shrug and head rotation.  Mildly right tilt of the head and  increased right cervical muscle tension.  XII:  Normal tongue strength and range of motion, no deviation or fasciculation.  MOTOR:  No atrophy, fasciculations or abnormal movements.  No pronator drift. There is no grip myotonia or percussion myotonia.  No muscle fatigability.  Upper Extremity:  Right  Left  Deltoid  5/5   5/5   Biceps  5/5   5/5   Triceps  5/5   5/5   Infraspinatus 5/5  5/5  Medial pectoralis 5/5  5/5  Wrist extensors  5/5   5/5   Wrist flexors  5/5   5/5   Finger extensors  5/5   5/5   Finger flexors  5/5   5/5   Dorsal interossei  5/5   5/5   Abductor pollicis  5/5   5/5   Tone (Ashworth scale)  0  0   Lower Extremity:  Right  Left  Hip flexors  5/5   5/5   Hip extensors  5/5   5/5   Adductor 5/5  5/5  Abductor 5/5  5/5  Knee flexors  5/5   5/5   Knee extensors  5/5   5/5   Dorsiflexors  5/5   5/5   Plantarflexors  5/5   5/5   Toe extensors  5/5   5/5   Toe flexors  5/5   5/5   Tone (Ashworth scale)  0  0   MSRs:  Right        Left                  brachioradialis 2+  2+  biceps 2+  2+  triceps 2+  2+  patellar 2+  2+  ankle jerk 2+  2+  Hoffman no  no  plantar response down  down   SENSORY:  Normal and symmetric perception of light touch, pinprick, vibration, and proprioception.  Romberg's sign absent.   COORDINATION/GAIT: Normal finger-to- nose-finger and heel-to-shin.  Intact rapid alternating movements bilaterally.  Able to rise from a chair without using arms.  Gait narrow based and stable. Tandem and stressed gait intact.    IMPRESSION: Mrs. Callens is a 43 year old female referred with multitude of symptoms including unexplained hives, polyarthralgias, myalgias, tendinopathy, and cervical dystonia.  Besides mild evidence of cervical dystonia, her overall neurological exam is largely unremarkable.  She has good motor strength, intact sensation, intact reflexes, and no  evidence of myotonia or abnormal facies.  Clinical exam does not suggest  multiple sclerosis and prior MRI brain has also been normal.  Her symptoms do not clearly tie together for a primary neuromuscular disorder and tend to favor more of a musculoskeletal pathology such as connective tissue disorder, especially with her tendinopathy and hives.  Absence of exercise intolerance or liver involvement, makes glucose storage disorders less likely.  To be complete, she will undergo electrodiagnostic testing of the right arm and leg to evaluate for myopathy, but my overall suspicion is very low given full and intact motor strength on exam.    Greater than 50% of this 60 minute visit was spent in counseling, explanation of diagnosis, planning of further management, and coordination of care.   Thank you for allowing me to participate in patient's care.  If I can answer any additional questions, I would be pleased to do so.    Sincerely,    Klaudia Beirne K. Allena Katz, DO

## 2019-01-08 ENCOUNTER — Ambulatory Visit: Payer: Commercial Managed Care - PPO | Admitting: Family Medicine

## 2019-01-09 ENCOUNTER — Other Ambulatory Visit: Payer: Self-pay | Admitting: Family Medicine

## 2019-01-24 ENCOUNTER — Encounter: Payer: Commercial Managed Care - PPO | Admitting: Neurology

## 2019-02-07 ENCOUNTER — Other Ambulatory Visit: Payer: Self-pay | Admitting: Family Medicine

## 2019-02-19 ENCOUNTER — Encounter: Payer: Commercial Managed Care - PPO | Admitting: Neurology

## 2019-03-04 ENCOUNTER — Other Ambulatory Visit: Payer: Self-pay | Admitting: Family Medicine

## 2019-03-12 ENCOUNTER — Other Ambulatory Visit: Payer: Self-pay | Admitting: Family Medicine

## 2019-03-26 ENCOUNTER — Encounter: Payer: Commercial Managed Care - PPO | Admitting: Neurology

## 2019-03-27 ENCOUNTER — Other Ambulatory Visit: Payer: Self-pay | Admitting: Family Medicine

## 2019-03-28 ENCOUNTER — Other Ambulatory Visit: Payer: Self-pay

## 2019-03-28 ENCOUNTER — Encounter: Payer: Self-pay | Admitting: Family Medicine

## 2019-03-28 ENCOUNTER — Telehealth (INDEPENDENT_AMBULATORY_CARE_PROVIDER_SITE_OTHER): Payer: Managed Care, Other (non HMO) | Admitting: Family Medicine

## 2019-03-28 DIAGNOSIS — G44209 Tension-type headache, unspecified, not intractable: Secondary | ICD-10-CM

## 2019-03-28 DIAGNOSIS — F458 Other somatoform disorders: Secondary | ICD-10-CM | POA: Diagnosis not present

## 2019-03-28 DIAGNOSIS — G43909 Migraine, unspecified, not intractable, without status migrainosus: Secondary | ICD-10-CM

## 2019-03-28 MED ORDER — RIZATRIPTAN BENZOATE 10 MG PO TABS: ORAL_TABLET | ORAL | 0 refills | Status: DC

## 2019-03-28 MED ORDER — TIZANIDINE HCL 2 MG PO TABS: ORAL_TABLET | ORAL | 0 refills | Status: DC

## 2019-03-28 NOTE — Progress Notes (Signed)
Virtual Visit via Video Note  I connected with Leslie Hughes  on 03/28/19 at 12:00 PM EST by a video enabled telemedicine application and verified that I am speaking with the correct person using two identifiers.  Location patient: home Location provider:work or home office Persons participating in the virtual visit: patient, provider  I discussed the limitations of evaluation and management by telemedicine and the availability of in person appointments. The patient expressed understanding and agreed to proceed.   HPI:  Acute visit for Migraines: -has had migraines her whole life -migraines are stable -usually gets with weather changes, weather change is coming and needs refill before it hits -medications: maxalt -she also uses tizanidine at night for teeth grinding and reports if doesn't take can sometimes get a migraine - requests refill of this as well -due for several health maintenance measures  -denies any changes in chronic migraines or symptoms, and currently has no symptoms   ROS: See pertinent positives and negatives per HPI.  Past Medical History:  Diagnosis Date  . Allergy   . Cervical dystonia    sees Dr. Tonye Royalty at [facility]   . Degenerative arthritis of right knee 09/10/2015   Started viscous supplementation impression 22nd 2017  . Metrorrhagia    sees Dr. Marylynn Pearson   . Migraine   . Vitamin D deficiency     Past Surgical History:  Procedure Laterality Date  . CESAREAN SECTION  2009  . KNEE SURGERY  1999 and 2008   arthroscopy to right knee (tennis injuries)    Family History  Problem Relation Age of Onset  . Hypothyroidism Mother   . Thyroid disease Mother   . Cancer Father        bladder  . Hyperlipidemia Father   . Hypertension Father   . Skin cancer Paternal Grandmother   . Colon cancer Paternal Grandfather     SOCIAL HX: see hpi   Current Outpatient Medications:  .  Diclofenac Sodium 2 % SOLN, Apply 1 pump twice daily. (Patient taking  differently: Apply 1 pump twice daily as needed for knee), Disp: 112 g, Rfl: 3 .  meloxicam (MOBIC) 15 MG tablet, TAKE 1 TABLET(15 MG) BY MOUTH DAILY (Patient taking differently: As needed for knee), Disp: 90 tablet, Rfl: 3 .  Multiple Vitamin (MULTIVITAMIN) tablet, Take 1 tablet by mouth daily.  , Disp: , Rfl:  .  Norethin Ace-Eth Estrad-FE (LOESTRIN FE 1/20 PO), Take by mouth daily. Lo lo estrin, Disp: , Rfl:  .  rizatriptan (MAXALT) 10 MG tablet, TAKE 1 TABLET BY MOUTH AS NEEDED FOR MIGRAINE. MAY REPEAT ONCE IN 2 HOURS, Disp: 10 tablet, Rfl: 0 .  tiZANidine (ZANAFLEX) 2 MG tablet, 1 tablet nightly as needed, Disp: 30 tablet, Rfl: 0 .  Vitamin D, Ergocalciferol, (DRISDOL) 1.25 MG (50000 UT) CAPS capsule, Take 1 capsule (50,000 Units total) by mouth every 7 (seven) days., Disp: 12 capsule, Rfl: 0 .  Vitamin D, Ergocalciferol, (DRISDOL) 1.25 MG (50000 UT) CAPS capsule, Take one capsule by mouth every 7 days., Disp: 12 capsule, Rfl: 0  EXAM:  VITALS per patient if applicable:  GENERAL: alert, oriented, appears well and in no acute distress  HEENT: atraumatic, conjunttiva clear, no obvious abnormalities on inspection of external nose and ears  NECK: normal movements of the head and neck  LUNGS: on inspection no signs of respiratory distress, breathing rate appears normal, no obvious gross SOB, gasping or wheezing  CV: no obvious cyanosis  MS: moves all visible extremities  without noticeable abnormality  PSYCH/NEURO: pleasant and cooperative, no obvious depression or anxiety, speech and thought processing grossly intact  ASSESSMENT AND PLAN:  Discussed the following assessment and plan:  Migraine without status migrainosus, not intractable, unspecified migraine type -assessed status, stable -medication management: refilled abortive medication maxalt  Teeth grinding -discussed -assessed as stable  Muscle tension headache -stable -triggered by teeth grinding -medication  management: she feel tizanidine helps, discussed risks -updated medication dosing per patient report and provided refill    I discussed the assessment and treatment plan with the patient. The patient was provided an opportunity to ask questions and all were answered. The patient agreed with the plan and demonstrated an understanding of the instructions.   The patient was advised to call back or seek an in-person evaluation if the symptoms worsen or if the condition fails to improve as anticipated.   Lucretia Kern, DO

## 2019-04-20 ENCOUNTER — Other Ambulatory Visit: Payer: Self-pay | Admitting: Family Medicine

## 2019-05-30 ENCOUNTER — Other Ambulatory Visit: Payer: Self-pay | Admitting: Family Medicine

## 2019-07-13 ENCOUNTER — Other Ambulatory Visit: Payer: Self-pay | Admitting: Family Medicine

## 2019-07-16 NOTE — Telephone Encounter (Signed)
Patient need to schedule an ov for more refills. 

## 2019-08-16 ENCOUNTER — Other Ambulatory Visit: Payer: Self-pay

## 2019-08-20 ENCOUNTER — Other Ambulatory Visit: Payer: Self-pay

## 2019-08-20 ENCOUNTER — Ambulatory Visit (INDEPENDENT_AMBULATORY_CARE_PROVIDER_SITE_OTHER): Payer: Managed Care, Other (non HMO) | Admitting: Family Medicine

## 2019-08-20 ENCOUNTER — Encounter: Payer: Self-pay | Admitting: Family Medicine

## 2019-08-20 VITALS — BP 120/66 | HR 82 | Temp 98.2°F | Wt 146.2 lb

## 2019-08-20 DIAGNOSIS — G43909 Migraine, unspecified, not intractable, without status migrainosus: Secondary | ICD-10-CM

## 2019-08-20 DIAGNOSIS — G243 Spasmodic torticollis: Secondary | ICD-10-CM

## 2019-08-20 LAB — LIPID PANEL
Cholesterol: 226 mg/dL — ABNORMAL HIGH (ref 0–200)
HDL: 63.1 mg/dL (ref >39.00)
LDL Cholesterol: 148 mg/dL — ABNORMAL HIGH (ref 0–99)
NonHDL: 163.13
Total CHOL/HDL Ratio: 4
Triglycerides: 76 mg/dL (ref 0.0–149.0)
VLDL: 15.2 mg/dL (ref 0.0–40.0)

## 2019-08-20 LAB — BASIC METABOLIC PANEL
BUN: 12 mg/dL (ref 6–23)
CO2: 26 mEq/L (ref 19–32)
Calcium: 9.8 mg/dL (ref 8.4–10.5)
Chloride: 102 mEq/L (ref 96–112)
Creatinine, Ser: 0.74 mg/dL (ref 0.40–1.20)
GFR: 86.29 mL/min (ref >60.00)
Glucose, Bld: 71 mg/dL (ref 70–99)
Potassium: 4.3 mEq/L (ref 3.5–5.1)
Sodium: 137 mEq/L (ref 135–145)

## 2019-08-20 LAB — CBC WITH DIFFERENTIAL/PLATELET
Basophils Absolute: 0 10*3/uL (ref 0.0–0.1)
Basophils Relative: 0.3 % (ref 0.0–3.0)
Eosinophils Absolute: 0 10*3/uL (ref 0.0–0.7)
Eosinophils Relative: 0.3 % (ref 0.0–5.0)
HCT: 41.3 % (ref 36.0–46.0)
Hemoglobin: 14 g/dL (ref 12.0–15.0)
Lymphocytes Relative: 22 % (ref 12.0–46.0)
Lymphs Abs: 2 10*3/uL (ref 0.7–4.0)
MCHC: 34 g/dL (ref 30.0–36.0)
MCV: 91.1 fl (ref 78.0–100.0)
Monocytes Absolute: 0.6 10*3/uL (ref 0.1–1.0)
Monocytes Relative: 6.3 % (ref 3.0–12.0)
Neutro Abs: 6.3 10*3/uL (ref 1.4–7.7)
Neutrophils Relative %: 71.1 % (ref 43.0–77.0)
Platelets: 309 10*3/uL (ref 150.0–400.0)
RBC: 4.53 Mil/uL (ref 3.87–5.11)
RDW: 13.1 % (ref 11.5–15.5)
WBC: 8.9 10*3/uL (ref 4.0–10.5)

## 2019-08-20 LAB — HEPATIC FUNCTION PANEL
ALT: 19 U/L (ref 0–35)
AST: 19 U/L (ref 0–37)
Albumin: 5 g/dL (ref 3.5–5.2)
Alkaline Phosphatase: 56 U/L (ref 39–117)
Bilirubin, Direct: 0.1 mg/dL (ref 0.0–0.3)
Total Bilirubin: 0.6 mg/dL (ref 0.2–1.2)
Total Protein: 7.7 g/dL (ref 6.0–8.3)

## 2019-08-20 LAB — TSH: TSH: 0.85 u[IU]/mL (ref 0.35–4.50)

## 2019-08-20 MED ORDER — RIZATRIPTAN BENZOATE 10 MG PO TABS: ORAL_TABLET | ORAL | 3 refills | Status: DC

## 2019-08-20 MED ORDER — TIZANIDINE HCL 2 MG PO TABS: 2.0000 mg | ORAL_TABLET | Freq: Three times a day (TID) | ORAL | 3 refills | Status: DC

## 2019-08-20 NOTE — Progress Notes (Signed)
   Subjective:    Patient ID: Danley Danker, female    DOB: 09/24/1975, 44 y.o.   MRN: [id number]  HPI Here to follow up on migraines and neck pain. She has been doing well I general. She sees Dr. Tamala Julian for severe degenerative arthritis in the right knee which is stable. She has given up plating tennis.    Review of Systems  Constitutional: Negative.   Respiratory: Negative.   Cardiovascular: Negative.   Musculoskeletal: Positive for arthralgias, neck pain and neck stiffness.  Neurological: Positive for headaches.       Objective:   Physical Exam Constitutional:      Appearance: Normal appearance.  Cardiovascular:     Rate and Rhythm: Normal rate and regular rhythm.     Pulses: Normal pulses.     Heart sounds: Normal heart sounds.  Neurological:     General: No focal deficit present.     Mental Status: She is alert and oriented to person, place, and time.           Assessment & Plan:  Her migraines and neck pain are stable, so the Zanaflex and the Maxalt are refilled. Get fasting labs today.  [name], MD

## 2020-02-25 ENCOUNTER — Other Ambulatory Visit: Payer: Self-pay | Admitting: Family Medicine

## 2020-05-31 ENCOUNTER — Other Ambulatory Visit: Payer: Self-pay | Admitting: Family Medicine

## 2020-09-16 ENCOUNTER — Other Ambulatory Visit: Payer: Self-pay | Admitting: Family Medicine

## 2020-11-25 ENCOUNTER — Telehealth (INDEPENDENT_AMBULATORY_CARE_PROVIDER_SITE_OTHER): Payer: Managed Care, Other (non HMO) | Admitting: Family Medicine

## 2020-11-25 ENCOUNTER — Encounter: Payer: Self-pay | Admitting: Family Medicine

## 2020-11-25 DIAGNOSIS — F4321 Adjustment disorder with depressed mood: Secondary | ICD-10-CM | POA: Diagnosis not present

## 2020-11-25 DIAGNOSIS — G243 Spasmodic torticollis: Secondary | ICD-10-CM

## 2020-11-25 DIAGNOSIS — G43909 Migraine, unspecified, not intractable, without status migrainosus: Secondary | ICD-10-CM

## 2020-11-25 MED ORDER — LORAZEPAM 0.5 MG PO TABS: 0.5000 mg | ORAL_TABLET | Freq: Four times a day (QID) | ORAL | 1 refills | Status: DC | PRN

## 2020-11-25 MED ORDER — RIZATRIPTAN BENZOATE 10 MG PO TABS: ORAL_TABLET | ORAL | 5 refills | Status: DC

## 2020-11-25 MED ORDER — TIZANIDINE HCL 2 MG PO TABS: 2.0000 mg | ORAL_TABLET | Freq: Three times a day (TID) | ORAL | 3 refills | Status: DC

## 2020-11-25 NOTE — Progress Notes (Signed)
[facility] [place] [address] [place] Phone: [phone] Subjective:   IVilma Meckel, am serving as a scribe for Dr. Hulan Saas. This visit occurred during the SARS-CoV-2 public health emergency.  Safety protocols were in place, including screening questions prior to the visit, additional usage of staff PPE, and extensive cleaning of exam room while observing appropriate contact time as indicated for disinfecting solutions.   I'm seeing this patient by the request  of:  Laurey Morale, MD  CC: Right knee pain  [id number]  Jalice Bobel is a 45 y.o. female coming in with complaint of knee pain. Patient last seen in 2020 for OMT. R knee injected n 2017. Patient states she is having right knee pain and shin splints in her left leg. She has been feeling a knot mid tibia over the last month that has been tender to touch.  Mostly seems to the right knee.  May be compensating.  He does not remember any true injury.      Past Medical History:  Diagnosis Date   Allergy    Cervical dystonia    sees Dr. Tonye Royalty at [facility]    Degenerative arthritis of right knee 09/10/2015   Started viscous supplementation impression 22nd 2017   Metrorrhagia    sees Dr. Marylynn Pearson    Migraine    Vitamin D deficiency    Past Surgical History:  Procedure Laterality Date   CESAREAN SECTION  2009   [place] and 2008   arthroscopy to right knee (tennis injuries)   Social History   Socioeconomic History   Marital status: Married    Spouse name: Not on file   Number of children: 1   Years of education: 16   Highest education level: Not on file  Occupational History   Occupation: restaurant  Tobacco Use   Smoking status: Never   Smokeless tobacco: Never  Vaping Use   Vaping Use: Never used  Substance and Sexual Activity   Alcohol use: Yes    Alcohol/week: 0.0 standard drinks    Comment: occ   Drug use: No   Sexual activity:  Not on file  Other Topics Concern   Not on file  Social History Narrative   Right handed   Two story home   College grad   She lives with husband and son   She own Tourist information centre manager   Social Determinants of Health   Financial Resource Strain: Not on file  Food Insecurity: Not on file  Transportation Needs: Not on file  Physical Activity: Not on file  Stress: Not on file  Social Connections: Not on file   Allergies  Allergen Reactions   Amoxicillin Rash   Amoxicillin    Family History  Problem Relation Age of Onset   Hypothyroidism Mother    Thyroid disease Mother    Cancer Father        bladder   Hyperlipidemia Father    Hypertension Father    Skin cancer Paternal Grandmother    Colon cancer Paternal Grandfather     Current Outpatient Medications (Endocrine & Metabolic):    Norethin Ace-Eth Estrad-FE (LOESTRIN FE 1/20 PO), Take by mouth daily. Lo lo estrin    Current Outpatient Medications (Analgesics):    meloxicam (MOBIC) 15 MG tablet, TAKE 1 TABLET(15 MG) BY MOUTH DAILY   rizatriptan (MAXALT) 10 MG tablet, TAKE 1 TABLET BY MOUTH, MAY REPEAT IN 2 HOURS IF NEEDED  Current Outpatient Medications (Other):    LORazepam (ATIVAN) 0.5 MG tablet, Take 1 tablet (0.5 mg total) by mouth every 6 (six) hours as needed for anxiety or sleep.   Multiple Vitamin (MULTIVITAMIN) tablet, Take 1 tablet by mouth daily.     tiZANidine (ZANAFLEX) 2 MG tablet, Take 1 tablet (2 mg total) by mouth 3 (three) times daily. TAKE 1 TABLET BY MOUTH EVERY NIGHT AS NEEDED   Vitamin D, Ergocalciferol, (DRISDOL) 1.25 MG (50000 UT) CAPS capsule, Take one capsule by mouth every 7 days.   Reviewed prior external information including notes and imaging from  primary care provider As well as notes that were available from care everywhere and other healthcare systems.  Past medical history, social, surgical and family history all reviewed in electronic medical record.  No pertanent information  unless stated regarding to the chief complaint.   Review of Systems:  No headache, visual changes, nausea, vomiting, diarrhea, constipation, dizziness, abdominal pain, skin rash, fevers, chills, night sweats, weight loss, swollen lymph nodes, body aches, joint swelling, chest pain, shortness of breath, mood changes. POSITIVE muscle aches  Objective  Blood pressure 122/82, pulse 93, height '5\' 9"'$  (1.753 m), weight 147 lb (66.7 kg), SpO2 99 %.   General: No apparent distress alert and oriented x3 mood and affect normal, dressed appropriately.  HEENT: Pupils equal, extraocular movements intact  Respiratory: Patient's speak in full sentences and does not appear short of breath  Cardiovascular: No lower extremity edema, non tender, no erythema  Gait normal with good balance and coordination.  MSK: Right knee exam shows the patient does have some mild lateral tracking of the patella.  Trace effusion noted compared to the contralateral side.  Patient does have a positive patellar grind test.  Likely no significant instability noted with varus and valgus stress  Left shin exam shows the patient does have approximately of the proximal distal tibia has an area where there is a small mass appreciated.  It is severely tender.  Seems to be soft tissue movable.  Limited muscular skeletal ultrasound was performed and interpreted by Hulan Saas, M  Limited ultrasound of the left tibia area seems to have more of a soft tissue area with mild vascularity noted.  Could be potentially a callus over the bone consistent with Stress reaction.  After informed written and verbal consent, patient was seated on exam table. Right knee was prepped with alcohol swab and utilizing anterolateral approach, patient's right knee space was injected with 4:1  marcaine 0.5%: Kenalog '40mg'$ /dL. Patient tolerated the procedure well without immediate complications.    Impression and Recommendations:    The above documentation has  been reviewed and is accurate and complete Lyndal Pulley, DO

## 2020-11-25 NOTE — Progress Notes (Signed)
Subjective:    Patient ID: Leslie Hughes, female    DOB: [birth year], 45 y.o.   MRN: [id number]  HPI Virtual Visit via Video Note  I connected with the patient on 11/25/20 at  8:45 AM EDT by a video enabled telemedicine application and verified that I am speaking with the correct person using two identifiers.  Location patient: home Location provider:work or home office Persons participating in the virtual visit: patient, provider  I discussed the limitations of evaluation and management by telemedicine and the availability of in person appointments. The patient expressed understanding and agreed to proceed.   HPI: Here for refills on Maxlt and Tizanidine, and for a new issue. Her neck pain and migraines are stable. However her mother, who live din [place], passed away 2 weeks ago, and Leslie Hughes has had a hard time dealing with this. She cannot focus at work, and she breaks down crying at various times. She also cannot sleep and asks for help. She is talking to clergy to help get through the grief process.    ROS: See pertinent positives and negatives per HPI.  Past Medical History:  Diagnosis Date   Allergy    Cervical dystonia    sees Dr. Tonye Royalty at [facility]    Degenerative arthritis of right knee 09/10/2015   Started viscous supplementation impression 22nd 2017   Metrorrhagia    sees Dr. Marylynn Pearson    Migraine    Vitamin D deficiency     Past Surgical History:  Procedure Laterality Date   CESAREAN SECTION  2009   [place] and 2008   arthroscopy to right knee (tennis injuries)    Family History  Problem Relation Age of Onset   Hypothyroidism Mother    Thyroid disease Mother    Cancer Father        bladder   Hyperlipidemia Father    Hypertension Father    Skin cancer Paternal Grandmother    Colon cancer Paternal Grandfather      Current Outpatient Medications:    meloxicam (MOBIC) 15 MG tablet, TAKE 1 TABLET(15 MG) BY MOUTH DAILY, Disp: 90  tablet, Rfl: 3   Multiple Vitamin (MULTIVITAMIN) tablet, Take 1 tablet by mouth daily.  , Disp: , Rfl:    Norethin Ace-Eth Estrad-FE (LOESTRIN FE 1/20 PO), Take by mouth daily. Lo lo estrin, Disp: , Rfl:    rizatriptan (MAXALT) 10 MG tablet, TAKE 1 TABLET BY MOUTH, MAY REPEAT IN 2 HOURS IF NEEDED, Disp: 30 tablet, Rfl: 1   tiZANidine (ZANAFLEX) 2 MG tablet, Take 1 tablet (2 mg total) by mouth 3 (three) times daily. TAKE 1 TABLET BY MOUTH EVERY NIGHT AS NEEDED, Disp: 270 tablet, Rfl: 3   Vitamin D, Ergocalciferol, (DRISDOL) 1.25 MG (50000 UT) CAPS capsule, Take one capsule by mouth every 7 days., Disp: 12 capsule, Rfl: 0  EXAM:  VITALS per patient if applicable:  GENERAL: alert, oriented, appears well and in no acute distress  HEENT: atraumatic, conjunttiva clear, no obvious abnormalities on inspection of external nose and ears  NECK: normal movements of the head and neck  LUNGS: on inspection no signs of respiratory distress, breathing rate appears normal, no obvious gross SOB, gasping or wheezing  CV: no obvious cyanosis  MS: moves all visible extremities without noticeable abnormality  PSYCH/NEURO: pleasant and cooperative, no obvious depression or anxiety, speech and thought processing grossly intact  ASSESSMENT AND PLAN: Migraines and neck pain are stable. Maxalt and Tizanidine are refilled. She  is also having a grief reaction, causing anxiety and insmonia. She will try Lorazepam 0.5 mg to take as needed. I suggested she take 2 of these at bedtime. Recheck as needed.  Leslie Penna, MD  Discussed the following assessment and plan:  No diagnosis found.     I discussed the assessment and treatment plan with the patient. The patient was provided an opportunity to ask questions and all were answered. The patient agreed with the plan and demonstrated an understanding of the instructions.   The patient was advised to call back or seek an in-person evaluation if the symptoms worsen or  if the condition fails to improve as anticipated.      Review of Systems     Objective:   Physical Exam        Assessment & Plan:

## 2020-11-27 ENCOUNTER — Ambulatory Visit: Payer: Managed Care, Other (non HMO) | Admitting: Family Medicine

## 2020-11-27 ENCOUNTER — Encounter: Payer: Self-pay | Admitting: Family Medicine

## 2020-11-27 ENCOUNTER — Ambulatory Visit (INDEPENDENT_AMBULATORY_CARE_PROVIDER_SITE_OTHER): Payer: Managed Care, Other (non HMO)

## 2020-11-27 ENCOUNTER — Other Ambulatory Visit: Payer: Self-pay

## 2020-11-27 ENCOUNTER — Ambulatory Visit: Payer: Self-pay

## 2020-11-27 VITALS — BP 122/82 | HR 93 | Ht 69.0 in | Wt 147.0 lb

## 2020-11-27 DIAGNOSIS — M1711 Unilateral primary osteoarthritis, right knee: Secondary | ICD-10-CM

## 2020-11-27 DIAGNOSIS — M898X6 Other specified disorders of bone, lower leg: Secondary | ICD-10-CM

## 2020-11-27 DIAGNOSIS — M25561 Pain in right knee: Secondary | ICD-10-CM

## 2020-11-27 DIAGNOSIS — M79662 Pain in left lower leg: Secondary | ICD-10-CM | POA: Diagnosis not present

## 2020-11-27 NOTE — Patient Instructions (Addendum)
Xray today Read about PRP Keep an eye on the bump See you again in 5 weeks

## 2020-11-27 NOTE — Assessment & Plan Note (Signed)
Patellofemoral degenerative changes and chondromalacia.  Initially responded well to the viscosupplementation but unfortunately again had a potential allergic reaction on the last appointment which makes patient as well as more self-conscious of doing it again.  Steroid injection given today to see how patient tolerates it.  Patient given a handout about PRP.  Depending on how she would potentially respond to that then we will go back to the viscosupplementation understanding there is a chance of some mild allergic reaction.  Hopefully patient will respond well to these treatments and follow-up with me again 4 to 6 weeks

## 2020-11-27 NOTE — Assessment & Plan Note (Signed)
Patient does have left shin pain.  The patient does have an area that seems to be more soft tissue.  We discussed massage as well as graston tool.  If continues to give [place] trouble would consider further imaging.  On ultrasound today does have some mild vascularity but seems to be more secondary to an injury of a venous area.  Does not seem to be anything such as a clot.  We did discuss this would be a low likelihood.  Patient knows if any worsening symptoms advanced imaging would be warranted.

## 2020-12-11 ENCOUNTER — Ambulatory Visit: Payer: Managed Care, Other (non HMO) | Admitting: Family Medicine

## 2020-12-14 ENCOUNTER — Encounter: Payer: Self-pay | Admitting: Family Medicine

## 2020-12-15 NOTE — Telephone Encounter (Signed)
Yes she may take 2 tablets at a time

## 2020-12-29 NOTE — Progress Notes (Signed)
[place] [address] Phone: [phone] Subjective:   [name], am serving as a scribe for Dr. Hulan Saas.  This visit occurred during the SARS-CoV-2 public health emergency.  Safety protocols were in place, including screening questions prior to the visit, additional usage of staff PPE, and extensive cleaning of exam room while observing appropriate contact time as indicated for disinfecting solutions.   I'm seeing this patient by the request  of:  Laurey Morale, MD  CC: Right knee pain  [id number]  11/27/2020 Patient does have left shin pain.  The patient does have an area that seems to be more soft tissue.  We discussed massage as well as graston tool.  If continues to give [place] trouble would consider further imaging.  On ultrasound today does have some mild vascularity but seems to be more secondary to an injury of a venous area.  Does not seem to be anything such as a clot.  We did discuss this would be a low likelihood.  Patient knows if any worsening symptoms advanced imaging would be warranted.  Update 12/30/2020 Virginie Josten is a 45 y.o. female coming in with complaint of L shin and R knee pain. R knee injection last visit. Patient states that pain is constant even at night. Continues to have pain over L tibia since last visit. Has not been active much since last visit.   L tib/fib 11/27/2020 IMPRESSION: Unremarkable tib fib radiographs. If continued clinical concern consider further evaluation of the patient's palpable area of concern with dedicated ultrasound.   Previous MRI of patient's knee did show moderate chondral thinning of the patellofemoral joint.    Past Medical History:  Diagnosis Date   Allergy    Cervical dystonia    sees Dr. Tonye Royalty at [facility]    Degenerative arthritis of right knee 09/10/2015   Started viscous supplementation impression 22nd 2017   Metrorrhagia    sees Dr.  Marylynn Pearson    Migraine    Vitamin D deficiency    Past Surgical History:  Procedure Laterality Date   CESAREAN SECTION  2009   [place] and 2008   arthroscopy to right knee (tennis injuries)   Social History   Socioeconomic History   Marital status: Married    Spouse name: Not on file   Number of children: 1   Years of education: 16   Highest education level: Not on file  Occupational History   Occupation: restaurant  Tobacco Use   Smoking status: Never   Smokeless tobacco: Never  Vaping Use   Vaping Use: Never used  Substance and Sexual Activity   Alcohol use: Yes    Alcohol/week: 0.0 standard drinks    Comment: occ   Drug use: No   Sexual activity: Not on file  Other Topics Concern   Not on file  Social History Narrative   Right handed   Two story home   College grad   She lives with husband and son   She own Tourist information centre manager   Social Determinants of Health   Financial Resource Strain: Not on file  Food Insecurity: Not on file  Transportation Needs: Not on file  Physical Activity: Not on file  Stress: Not on file  Social Connections: Not on file   Allergies  Allergen Reactions   Amoxicillin Rash   Amoxicillin    Family History  Problem Relation Age of Onset   Hypothyroidism  Mother    Thyroid disease Mother    Cancer Father        bladder   Hyperlipidemia Father    Hypertension Father    Skin cancer Paternal Grandmother    Colon cancer Paternal Grandfather     Current Outpatient Medications (Endocrine & Metabolic):    Norethin Ace-Eth Estrad-FE (LOESTRIN FE 1/20 PO), Take by mouth daily. Lo lo estrin    Current Outpatient Medications (Analgesics):    meloxicam (MOBIC) 15 MG tablet, TAKE 1 TABLET(15 MG) BY MOUTH DAILY   rizatriptan (MAXALT) 10 MG tablet, TAKE 1 TABLET BY MOUTH, MAY REPEAT IN 2 HOURS IF NEEDED   Current Outpatient Medications (Other):    LORazepam (ATIVAN) 0.5 MG tablet, Take 1 tablet (0.5 mg total) by  mouth every 6 (six) hours as needed for anxiety or sleep.   Multiple Vitamin (MULTIVITAMIN) tablet, Take 1 tablet by mouth daily.     tiZANidine (ZANAFLEX) 2 MG tablet, Take 1 tablet (2 mg total) by mouth 3 (three) times daily. TAKE 1 TABLET BY MOUTH EVERY NIGHT AS NEEDED   Vitamin D, Ergocalciferol, (DRISDOL) 1.25 MG (50000 UT) CAPS capsule, Take one capsule by mouth every 7 days.    Objective  Blood pressure 102/74, pulse 74, height 5\' 9"  (1.753 m), weight 146 lb (66.2 kg), SpO2 98 %.   General: No apparent distress alert and oriented x3 mood and affect normal, dressed appropriately.  Gait normal with good balance and coordination.  MSK: After informed written and verbal consent, patient was seated on exam table. Right knee was prepped with alcohol swab and utilizing anterolateral approach, patient's right knee space was injected with 1 cc of 1% lidocaine and 5 cc of 3 centrifuge PRP.  Patient tolerated the procedure well without immediate complications.    Impression and Recommendations:    The above documentation has been reviewed and is accurate and complete Lyndal Pulley, DO

## 2020-12-31 ENCOUNTER — Ambulatory Visit: Payer: Self-pay | Admitting: Family Medicine

## 2020-12-31 ENCOUNTER — Other Ambulatory Visit: Payer: Self-pay

## 2020-12-31 DIAGNOSIS — M1711 Unilateral primary osteoarthritis, right knee: Secondary | ICD-10-CM

## 2020-12-31 NOTE — Patient Instructions (Signed)
No ice or IBU for 3 days See me in 4 weeks

## 2020-12-31 NOTE — Assessment & Plan Note (Signed)
Patient responded well to the PRP today.  Patient given a post PRP increase activity over the course of the next 6 weeks.  Patient was accompanied with husband.  Should do well overall.  Follow-up with me again in 6 weeks

## 2021-02-03 NOTE — Progress Notes (Signed)
[place] Phone: [phone] Subjective:   [name], am serving as a scribe for Dr. Hulan Saas. This visit occurred during the SARS-CoV-2 public health emergency.  Safety protocols were in place, including screening questions prior to the visit, additional usage of staff PPE, and extensive cleaning of exam room while observing appropriate contact time as indicated for disinfecting solutions.   I'm seeing this patient by the request  of:  Laurey Morale, MD  CC: Right knee pain  [id number]  12/31/2020 Patient responded well to the PRP today.  Patient given a post PRP increase activity over the course of the next 6 weeks.  Patient was accompanied with husband.  Should do well overall.  Follow-up with me again in 6 weeks  Update 02/04/2021 Leslie Hughes is a 45 y.o. female coming in with complaint of R knee pain.  Patient has no focal arthritic changes of the knee.  Patient was given PRP 1 month ago.  Patient states that she is not having as much tightness when going up and down stairs. Has not worked out much but notes an achiness that is constant over superior aspect of joint.       Past Medical History:  Diagnosis Date   Allergy    Cervical dystonia    sees Dr. Tonye Royalty at [facility]    Degenerative arthritis of right knee 09/10/2015   Started viscous supplementation impression 22nd 2017   Metrorrhagia    sees Dr. Marylynn Pearson    Migraine    Vitamin D deficiency    Past Surgical History:  Procedure Laterality Date   CESAREAN SECTION  2009   [place] and 2008   arthroscopy to right knee (tennis injuries)   Social History   Socioeconomic History   Marital status: Married    Spouse name: Not on file   Number of children: 1   Years of education: 16   Highest education level: Not on file  Occupational History   Occupation: restaurant  Tobacco Use   Smoking status: Never    Smokeless tobacco: Never  Vaping Use   Vaping Use: Never used  Substance and Sexual Activity   Alcohol use: Yes    Alcohol/week: 0.0 standard drinks    Comment: occ   Drug use: No   Sexual activity: Not on file  Other Topics Concern   Not on file  Social History Narrative   Right handed   Two story home   College grad   She lives with husband and son   She own Tourist information centre manager   Social Determinants of Health   Financial Resource Strain: Not on file  Food Insecurity: Not on file  Transportation Needs: Not on file  Physical Activity: Not on file  Stress: Not on file  Social Connections: Not on file   Allergies  Allergen Reactions   Amoxicillin Rash   Amoxicillin    Family History  Problem Relation Age of Onset   Hypothyroidism Mother    Thyroid disease Mother    Cancer Father        bladder   Hyperlipidemia Father    Hypertension Father    Skin cancer Paternal Grandmother    Colon cancer Paternal Grandfather     Current Outpatient Medications (Endocrine & Metabolic):    Norethin Ace-Eth Estrad-FE (LOESTRIN FE 1/20 PO), Take by mouth daily. Lo lo estrin    Current Outpatient Medications (Analgesics):  meloxicam (MOBIC) 15 MG tablet, TAKE 1 TABLET(15 MG) BY MOUTH DAILY   rizatriptan (MAXALT) 10 MG tablet, TAKE 1 TABLET BY MOUTH, MAY REPEAT IN 2 HOURS IF NEEDED   Current Outpatient Medications (Other):    LORazepam (ATIVAN) 0.5 MG tablet, Take 1 tablet (0.5 mg total) by mouth every 6 (six) hours as needed for anxiety or sleep.   Multiple Vitamin (MULTIVITAMIN) tablet, Take 1 tablet by mouth daily.     tiZANidine (ZANAFLEX) 2 MG tablet, Take 1 tablet (2 mg total) by mouth 3 (three) times daily. TAKE 1 TABLET BY MOUTH EVERY NIGHT AS NEEDED   Vitamin D, Ergocalciferol, (DRISDOL) 1.25 MG (50000 UT) CAPS capsule, Take one capsule by mouth every 7 days.     Objective  Blood pressure 120/88, pulse 63, height 5\' 9"  (1.753 m), weight 144 lb (65.3 kg), SpO2 99  %.   General: No apparent distress alert and oriented x3 mood and affect normal, dressed appropriately.  HEENT: Pupils equal, extraocular movements intact  Respiratory: Patient's speak in full sentences and does not appear short of breath  Cardiovascular: No lower extremity edema, non tender, no erythema  Gait normal with good balance and coordination.  MSK: Right knee exam shows the patient does not have any significant swelling noted on exam today.  No significant lateral tracking noted.  Patient does have very mild tenderness noted over the posterior medial joint line but otherwise fairly unremarkable.   Limited muscular skeletal ultrasound was performed and interpreted by Hulan Saas, M  Limited ultrasound of the knee does not show any significant hypoechoic changes noted at this time.  Still mild narrowing noted of the patellofemoral joint.  Medial and lateral joint lines seem to be unremarkable. Impression: Interval improvement of the swelling has that has been noticed previously.   Impression and Recommendations:     The above documentation has been reviewed and is accurate and complete Lyndal Pulley, DO

## 2021-02-04 ENCOUNTER — Ambulatory Visit: Payer: Managed Care, Other (non HMO) | Admitting: Family Medicine

## 2021-02-04 ENCOUNTER — Ambulatory Visit: Payer: Self-pay

## 2021-02-04 ENCOUNTER — Other Ambulatory Visit: Payer: Self-pay

## 2021-02-04 ENCOUNTER — Encounter: Payer: Self-pay | Admitting: Family Medicine

## 2021-02-04 VITALS — BP 120/88 | HR 63 | Ht 69.0 in | Wt 144.0 lb

## 2021-02-04 DIAGNOSIS — M25561 Pain in right knee: Secondary | ICD-10-CM

## 2021-02-04 DIAGNOSIS — G8929 Other chronic pain: Secondary | ICD-10-CM | POA: Diagnosis not present

## 2021-02-04 NOTE — Patient Instructions (Signed)
Good to see you! Knee looks fantastic Happy holidays See me when you need me

## 2021-03-06 ENCOUNTER — Other Ambulatory Visit: Payer: Self-pay | Admitting: Family Medicine

## 2021-03-10 ENCOUNTER — Telehealth (INDEPENDENT_AMBULATORY_CARE_PROVIDER_SITE_OTHER): Payer: Managed Care, Other (non HMO) | Admitting: Physician Assistant

## 2021-03-10 ENCOUNTER — Encounter: Payer: Self-pay | Admitting: Physician Assistant

## 2021-03-10 VITALS — Ht 69.0 in | Wt 135.0 lb

## 2021-03-10 DIAGNOSIS — R0981 Nasal congestion: Secondary | ICD-10-CM

## 2021-03-10 MED ORDER — PREDNISONE 20 MG PO TABS: 40.0000 mg | ORAL_TABLET | Freq: Every day | ORAL | 0 refills | Status: DC

## 2021-03-10 MED ORDER — DOXYCYCLINE HYCLATE 100 MG PO TABS: 100.0000 mg | ORAL_TABLET | Freq: Two times a day (BID) | ORAL | 0 refills | Status: DC

## 2021-03-10 NOTE — Progress Notes (Signed)
I acted as a Education administrator for [employer], PA-C Anselmo Pickler, LPN  Virtual Visit via Video Note   I, Leslie Hughes, connected with  Leslie Hughes  ([id number], February 18, 1976) on 03/10/21 at  3:00 PM EST by a video-enabled telemedicine application and verified that I am speaking with the correct person using two identifiers.  Location: Patient: Virtual Visit Location Patient: Home Provider: Virtual Visit Location Provider: Office/Clinic   I discussed the limitations of evaluation and management by telemedicine and the availability of in person appointments. The patient expressed understanding and agreed to proceed.    History of Present Illness: Leslie Hughes is a 45 y.o. who identifies as a female who was assigned female at birth, and is being seen today for sinus problem.   Patient tested positive for COVID on 12/12.  She is having severe sinus pressure, headache, facial pain.  She has been using Sudafed and Allegra-D.  She was not prescribed any antiviral for COVID, was told that she did not have risk factors to warrant these medications.  She had fever at the beginning of her illness but does not have fever anymore.  She does have a history of sinus infections but states that this is the worst sinus infection she is ever had due to the amount of pressure in her face.  Denies worst headache of life, changes in vision, obvious unilateral swelling or changes to face.  HPI: HPI  Problems:  Patient Active Problem List   Diagnosis Date Noted   Pain in left shin 11/27/2020   Hives 10/25/2017   Nonallopathic lesion of cervical region 05/10/2017   Nonallopathic lesion of thoracic region 05/10/2017   Nonallopathic lesion of rib cage 05/10/2017   Cervical radiculopathy at C6 05/01/2017   Pain in left tibia 03/02/2017   Patellar subluxation, right, initial encounter 06/24/2016   Tear of LCL (lateral collateral ligament) of knee, right, initial encounter 12/31/2015   Sprain, quadricep  12/31/2015   Degenerative arthritis of right knee 09/10/2015   Leg length discrepancy 06/23/2015   Medial tibial stress syndrome 01/13/2015   Right knee pain 01/12/2015   Vitamin D deficiency 09/19/2011   Migraines 09/09/2010   Cervical dystonia 09/09/2010   Metrorrhagia 09/09/2010    Allergies:  Allergies  Allergen Reactions   Amoxicillin Rash   Amoxicillin    Medications:  Current Outpatient Medications:    doxycycline (VIBRA-TABS) 100 MG tablet, Take 1 tablet (100 mg total) by mouth 2 (two) times daily., Disp: 20 tablet, Rfl: 0   LORazepam (ATIVAN) 0.5 MG tablet, Take 1 tablet (0.5 mg total) by mouth every 6 (six) hours as needed for anxiety or sleep., Disp: 120 tablet, Rfl: 1   meloxicam (MOBIC) 15 MG tablet, TAKE 1 TABLET(15 MG) BY MOUTH DAILY, Disp: 90 tablet, Rfl: 0   Multiple Vitamin (MULTIVITAMIN) tablet, Take 1 tablet by mouth daily.  , Disp: , Rfl:    Norethin Ace-Eth Estrad-FE (LOESTRIN FE 1/20 PO), Take by mouth daily. Lo lo estrin, Disp: , Rfl:    predniSONE (DELTASONE) 20 MG tablet, Take 2 tablets (40 mg total) by mouth daily., Disp: 10 tablet, Rfl: 0   rizatriptan (MAXALT) 10 MG tablet, TAKE 1 TABLET BY MOUTH, MAY REPEAT IN 2 HOURS IF NEEDED, Disp: 30 tablet, Rfl: 5   tiZANidine (ZANAFLEX) 2 MG tablet, Take 1 tablet (2 mg total) by mouth 3 (three) times daily. TAKE 1 TABLET BY MOUTH EVERY NIGHT AS NEEDED, Disp: 270 tablet, Rfl: 3   Vitamin D, Ergocalciferol, (DRISDOL)  1.25 MG (50000 UT) CAPS capsule, Take one capsule by mouth every 7 days., Disp: 12 capsule, Rfl: 0  Observations/Objective: Patient is well-developed, well-nourished in no acute distress.  Resting comfortably  at home.  Head is normocephalic, atraumatic.  No labored breathing.  Speech is clear and coherent with logical content.  Patient is alert and oriented at baseline.    Assessment and Plan: 1. Sinus congestion Suspect sinus infection, will initiate doxycycline 100 mg twice daily x10 days and  oral prednisone 20 mg twice daily x5 days per orders. Discussed taking medications as prescribed. Reviewed return precautions including worsening fever, SOB, worsening cough or other concerns. Push fluids and rest. I recommend that patient follow-up if symptoms worsen or persist despite treatment x 7-10 days, sooner if needed.   Follow Up Instructions: I discussed the assessment and treatment plan with the patient. The patient was provided an opportunity to ask questions and all were answered. The patient agreed with the plan and demonstrated an understanding of the instructions.  A copy of instructions were sent to the patient via MyChart unless otherwise noted below.   The patient was advised to call back or seek an in-person evaluation if the symptoms worsen or if the condition fails to improve as anticipated.  Leslie Hughes, [place]

## 2021-05-11 ENCOUNTER — Other Ambulatory Visit: Payer: Self-pay | Admitting: Family Medicine

## 2021-09-27 ENCOUNTER — Ambulatory Visit: Payer: Managed Care, Other (non HMO) | Admitting: Family Medicine

## 2021-09-27 ENCOUNTER — Encounter: Payer: Self-pay | Admitting: Family Medicine

## 2021-09-27 VITALS — BP 126/78 | HR 85 | Temp 99.2°F | Wt 139.8 lb

## 2021-09-27 DIAGNOSIS — F4321 Adjustment disorder with depressed mood: Secondary | ICD-10-CM

## 2021-09-27 DIAGNOSIS — R252 Cramp and spasm: Secondary | ICD-10-CM

## 2021-09-27 LAB — BASIC METABOLIC PANEL
BUN: 11 mg/dL (ref 6–23)
CO2: 25 mEq/L (ref 19–32)
Calcium: 10.1 mg/dL (ref 8.4–10.5)
Chloride: 105 mEq/L (ref 96–112)
Creatinine, Ser: 0.76 mg/dL (ref 0.40–1.20)
GFR: 95.87 mL/min (ref >60.00)
Glucose, Bld: 85 mg/dL (ref 70–99)
Potassium: 4.1 mEq/L (ref 3.5–5.1)
Sodium: 139 mEq/L (ref 135–145)

## 2021-09-27 MED ORDER — SERTRALINE HCL 50 MG PO TABS: 50.0000 mg | ORAL_TABLET | Freq: Every day | ORAL | 3 refills | Status: DC

## 2021-09-27 MED ORDER — RIZATRIPTAN BENZOATE 10 MG PO TABS: ORAL_TABLET | ORAL | 5 refills | Status: DC

## 2021-09-27 MED ORDER — ALPRAZOLAM 0.5 MG PO TABS: 0.5000 mg | ORAL_TABLET | Freq: Two times a day (BID) | ORAL | 2 refills | Status: DC | PRN
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
## 2021-10-11 ENCOUNTER — Encounter: Payer: Self-pay | Admitting: Family Medicine

## 2021-10-12 MED ORDER — ALPRAZOLAM 1 MG PO TABS: 1.0000 mg | ORAL_TABLET | Freq: Two times a day (BID) | ORAL | 2 refills | Status: DC | PRN

## 2021-10-12 NOTE — Telephone Encounter (Signed)
I doubled the dose to 1 mg and sent this in

## 2021-10-18 ENCOUNTER — Encounter: Payer: Self-pay | Admitting: Family Medicine

## 2021-10-18 ENCOUNTER — Ambulatory Visit: Payer: Managed Care, Other (non HMO) | Admitting: Family Medicine

## 2021-10-18 VITALS — BP 118/78 | HR 61 | Temp 98.7°F | Wt 138.0 lb

## 2021-10-18 DIAGNOSIS — F4321 Adjustment disorder with depressed mood: Secondary | ICD-10-CM

## 2021-10-18 NOTE — Progress Notes (Signed)
   Subjective:    Patient ID: Leslie Hughes, female    DOB: 12-16-1975, 46 y.o.   MRN: [id number]  HPI Here to follow up on grief and anxiety. We saw her 3 weeks ago, and she told me about losing her father the week before. She was struggling with grief and also with the stress of handing his estate. We started her on Zoloft 50 mg daily as well as Xanax as needed. She says these have helped her feel more stable and she is better able to take care of things. She still struggles with the grief of course.    Review of Systems  Constitutional: Negative.   Respiratory: Negative.    Cardiovascular: Negative.   Psychiatric/Behavioral:  Positive for dysphoric mood. The patient is nervous/anxious.        Objective:   Physical Exam Constitutional:      Appearance: Normal appearance.  Cardiovascular:     Rate and Rhythm: Normal rate and regular rhythm.     Pulses: Normal pulses.     Heart sounds: Normal heart sounds.  Pulmonary:     Effort: Pulmonary effort is normal.     Breath sounds: Normal breath sounds.  Neurological:     Mental Status: She is alert.  Psychiatric:        Behavior: Behavior normal.        Thought Content: Thought content normal.     Comments: She is quite tearful            Assessment & Plan:  Grief reaction with anxiety. We will stay on the current regimen. She will meet with a grief counselor from the Hospice program tomorrow. Recheck in 4 weeks.  [name], MD

## 2021-11-12 ENCOUNTER — Encounter: Payer: Self-pay | Admitting: Family Medicine

## 2021-11-12 ENCOUNTER — Ambulatory Visit: Payer: Managed Care, Other (non HMO) | Admitting: Family Medicine

## 2021-11-12 VITALS — BP 118/74 | HR 78 | Temp 98.8°F | Wt 130.0 lb

## 2021-11-12 DIAGNOSIS — F4320 Adjustment disorder, unspecified: Secondary | ICD-10-CM | POA: Insufficient documentation

## 2021-11-12 DIAGNOSIS — F4321 Adjustment disorder with depressed mood: Secondary | ICD-10-CM

## 2021-11-12 MED ORDER — ONDANSETRON HCL 8 MG PO TABS: 8.0000 mg | ORAL_TABLET | Freq: Three times a day (TID) | ORAL | 2 refills | Status: AC | PRN

## 2021-11-12 MED ORDER — SERTRALINE HCL 100 MG PO TABS: 100.0000 mg | ORAL_TABLET | Freq: Every day | ORAL | 3 refills | Status: DC
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
## 2021-11-26 ENCOUNTER — Encounter: Payer: Self-pay | Admitting: Family Medicine

## 2021-11-26 ENCOUNTER — Ambulatory Visit: Payer: Managed Care, Other (non HMO) | Admitting: Family Medicine

## 2022-01-05 ENCOUNTER — Other Ambulatory Visit: Payer: Self-pay | Admitting: Family Medicine

## 2022-01-05 NOTE — Telephone Encounter (Signed)
Last OV- 11/12/21 Last refill- 11-25-20-270 tabs, 3 refills  No future OV scheduled.

## 2022-01-12 ENCOUNTER — Other Ambulatory Visit: Payer: Self-pay | Admitting: Family Medicine

## 2022-01-13 NOTE — Telephone Encounter (Signed)
Last OV- 11/12/21 Last refill-10/12/21--60 tabs, 2 refills  No future OV scheduled.

## 2022-03-31 ENCOUNTER — Other Ambulatory Visit: Payer: Self-pay | Admitting: Family Medicine

## 2022-05-28 ENCOUNTER — Other Ambulatory Visit: Payer: Self-pay | Admitting: Family Medicine

## 2022-06-02 ENCOUNTER — Other Ambulatory Visit: Payer: Self-pay

## 2022-06-02 MED ORDER — SERTRALINE HCL 100 MG PO TABS: ORAL_TABLET | ORAL | 0 refills | Status: DC

## 2022-06-15 ENCOUNTER — Other Ambulatory Visit: Payer: Self-pay | Admitting: *Deleted

## 2022-06-15 MED ORDER — SERTRALINE HCL 100 MG PO TABS: ORAL_TABLET | ORAL | 0 refills | Status: DC

## 2022-07-19 ENCOUNTER — Encounter: Payer: Self-pay | Admitting: Family Medicine

## 2022-07-19 ENCOUNTER — Ambulatory Visit: Payer: Managed Care, Other (non HMO) | Admitting: Family Medicine

## 2022-07-19 VITALS — BP 120/82 | HR 71 | Temp 98.7°F | Wt 136.8 lb

## 2022-07-19 DIAGNOSIS — F418 Other specified anxiety disorders: Secondary | ICD-10-CM | POA: Diagnosis not present

## 2022-07-19 DIAGNOSIS — F4321 Adjustment disorder with depressed mood: Secondary | ICD-10-CM | POA: Diagnosis not present

## 2022-07-19 MED ORDER — ALPRAZOLAM 1 MG PO TABS
ORAL_TABLET | ORAL | 5 refills | Status: DC
Start: 2022-07-19 — End: 2023-01-30

## 2022-07-19 MED ORDER — BUPROPION HCL ER (XL) 150 MG PO TB24: 150.0000 mg | ORAL_TABLET | Freq: Every day | ORAL | 2 refills | Status: DC
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
## 2022-08-05 ENCOUNTER — Encounter: Payer: Self-pay | Admitting: Family Medicine

## 2022-08-09 MED ORDER — BUPROPION HCL ER (SR) 100 MG PO TB12: 100.0000 mg | ORAL_TABLET | Freq: Every morning | ORAL | 5 refills | Status: AC

## 2022-08-09 NOTE — Telephone Encounter (Signed)
I stopped the Wellbutrin XL 150 mg and instead sent in Wellbutrin SR 100 mg to take every morning. She should report back in about a month

## 2022-10-24 ENCOUNTER — Other Ambulatory Visit: Payer: Self-pay | Admitting: Family Medicine

## 2022-10-25 NOTE — Telephone Encounter (Signed)
07/19/22 office note She will follow up in 3-4 weeks.

## 2022-12-09 ENCOUNTER — Encounter: Payer: Self-pay | Admitting: Family Medicine

## 2022-12-09 ENCOUNTER — Ambulatory Visit: Payer: Managed Care, Other (non HMO) | Admitting: Family Medicine

## 2022-12-09 VITALS — BP 110/72 | HR 77 | Temp 98.4°F | Wt 148.2 lb

## 2022-12-09 DIAGNOSIS — F4321 Adjustment disorder with depressed mood: Secondary | ICD-10-CM

## 2022-12-09 DIAGNOSIS — M542 Cervicalgia: Secondary | ICD-10-CM | POA: Diagnosis not present

## 2022-12-09 DIAGNOSIS — F418 Other specified anxiety disorders: Secondary | ICD-10-CM

## 2022-12-09 MED ORDER — METHYLPREDNISOLONE ACETATE 80 MG/ML IJ SUSP
80.0000 mg | Freq: Once | INTRAMUSCULAR | Status: AC
Start: 2022-12-09 — End: 2022-12-09
  Administered 2022-12-09: 80 mg via INTRAMUSCULAR

## 2022-12-09 MED ORDER — METHYLPREDNISOLONE 4 MG PO TBPK: ORAL_TABLET | ORAL | 0 refills | Status: DC

## 2022-12-09 MED ORDER — SERTRALINE HCL 100 MG PO TABS: 50.0000 mg | ORAL_TABLET | Freq: Every day | ORAL | Status: AC

## 2022-12-09 MED ORDER — METHYLPREDNISOLONE ACETATE 40 MG/ML IJ SUSP
40.0000 mg | Freq: Once | INTRAMUSCULAR | Status: AC
Start: 2022-12-09 — End: 2022-12-09
  Administered 2022-12-09: 40 mg via INTRAMUSCULAR

## 2022-12-09 NOTE — Addendum Note (Signed)
Addended by: Carola Rhine on: 12/09/2022 09:29 AM   Modules accepted: Orders

## 2022-12-09 NOTE — Progress Notes (Signed)
Subjective:    Patient ID: Leslie Hughes, female    DOB: 03-14-1976, 47 y.o.   MRN: [id number]  HPI Here for one week of tightness and pain in the right neck and right shoulder. She periodically gets spasms in this area, and she has been applying heat and using a TENS unit. She has gotten relief with steroids in the past. As far as her depression and anxiety, things have been a little calmer in her life, and she wants to taper down on her medications.    Review of Systems  Constitutional: Negative.   Respiratory: Negative.    Cardiovascular: Negative.   Musculoskeletal:  Positive for neck pain.  Psychiatric/Behavioral:  Positive for dysphoric mood. The patient is nervous/anxious.        Objective:   Physical Exam Constitutional:      Appearance: Normal appearance.  Cardiovascular:     Rate and Rhythm: Normal rate and regular rhythm.     Pulses: Normal pulses.     Heart sounds: Normal heart sounds.  Pulmonary:     Effort: Pulmonary effort is normal.     Breath sounds: Normal breath sounds.  Musculoskeletal:     Comments: She is tender in the right posterior neck and the right superior trapezius. ROM of the neck is full but she has pain with this   Neurological:     Mental Status: She is alert.  Psychiatric:        Mood and Affect: Mood normal.        Behavior: Behavior normal.        Thought Content: Thought content normal.           Assessment & Plan:  Neck pain, she is given a shot of DepoMedrol, and we will follow this with a Medrol dose pack. Her depression and anxiety have improved a bit, so we will decrease the Zoloft to 50 mg daily.  Gershon Crane, MD

## 2022-12-26 ENCOUNTER — Other Ambulatory Visit: Payer: Self-pay | Admitting: Family Medicine

## 2023-01-26 ENCOUNTER — Other Ambulatory Visit: Payer: Self-pay | Admitting: Family Medicine

## 2023-02-10 ENCOUNTER — Ambulatory Visit
Admission: EM | Admit: 2023-02-10 | Discharge: 2023-02-10 | Disposition: A | Discharge status: Home / Self Care | Payer: Managed Care, Other (non HMO) | Attending: Family Medicine | Admitting: Family Medicine

## 2023-02-10 ENCOUNTER — Other Ambulatory Visit: Payer: Self-pay

## 2023-02-10 ENCOUNTER — Ambulatory Visit (INDEPENDENT_AMBULATORY_CARE_PROVIDER_SITE_OTHER): Payer: Self-pay

## 2023-02-10 DIAGNOSIS — M79602 Pain in left arm: Secondary | ICD-10-CM

## 2023-02-10 DIAGNOSIS — S5012XA Contusion of left forearm, initial encounter: Secondary | ICD-10-CM

## 2023-02-10 DIAGNOSIS — S63502A Unspecified sprain of left wrist, initial encounter: Secondary | ICD-10-CM

## 2023-02-10 DIAGNOSIS — M25532 Pain in left wrist: Secondary | ICD-10-CM

## 2023-02-10 DIAGNOSIS — M25522 Pain in left elbow: Secondary | ICD-10-CM

## 2023-02-10 MED ORDER — HYDROCODONE-ACETAMINOPHEN 5-325 MG PO TABS: 1.0000 | ORAL_TABLET | Freq: Four times a day (QID) | ORAL | 0 refills | Status: AC | PRN

## 2023-02-10 NOTE — ED Provider Notes (Signed)
[facility]    CSN: [id number] Arrival date & time: 02/10/23  1004      History   Chief Complaint No chief complaint on file.   HPI Leslie Hughes is a 47 y.o. female.   While climbing out of her car two days ago patient fell, bracing herself with her left hand wrist, while here forearm struck the doorsill.  She has had pain/swelling in her proximal left forearm and pain her her wrist.  She denies distal paresthesias.  The history is provided by the patient.  Wrist Pain This is a new problem. The current episode started 2 days ago. The problem occurs constantly. The problem has not changed since onset.Exacerbated by: left wrist and arm pain. Nothing relieves the symptoms. She has tried nothing for the symptoms.    Past Medical History:  Diagnosis Date   Allergy    Cervical dystonia    sees Dr. Zola Button at [facility]    Degenerative arthritis of right knee 09/10/2015   Started viscous supplementation impression 22nd 2017   Metrorrhagia    sees Dr. Zelphia Cairo    Migraine    Vitamin D deficiency     Patient Active Problem List   Diagnosis Date Noted   Depression with anxiety 07/19/2022   Grief reaction 11/12/2021   Pain in left shin 11/27/2020   Hives 10/25/2017   Nonallopathic lesion of cervical region 05/10/2017   Nonallopathic lesion of thoracic region 05/10/2017   Nonallopathic lesion of rib cage 05/10/2017   Cervical radiculopathy at C6 05/01/2017   Pain in left tibia 03/02/2017   Patellar subluxation, right, initial encounter 06/24/2016   Tear of LCL (lateral collateral ligament) of knee, right, initial encounter 12/31/2015   Sprain, quadricep 12/31/2015   Degenerative arthritis of right knee 09/10/2015   Leg length discrepancy 06/23/2015   Medial tibial stress syndrome 01/13/2015   Right knee pain 01/12/2015   Vitamin D deficiency 09/19/2011   Migraines 09/09/2010   Cervical dystonia 09/09/2010   Metrorrhagia 09/09/2010    Past  Surgical History:  Procedure Laterality Date   CESAREAN SECTION  2009   KNEE SURGERY  1999 and 2008   arthroscopy to right knee (tennis injuries)    OB History   No obstetric history on file.      Home Medications    Prior to Admission medications   Medication Sig Start Date End Date Taking? Authorizing Provider  HYDROcodone-acetaminophen (NORCO/VICODIN) 5-325 MG tablet Take 1 tablet by mouth every 6 (six) hours as needed for moderate pain (pain score 4-6) or severe pain (pain score 7-10). 02/10/23  Yes Lattie Haw, MD  ALPRAZolam (XANAX) 1 MG tablet TAKE 1 TABLET(1 MG) BY MOUTH TWICE DAILY AS NEEDED FOR ANXIETY. 01/30/23   Nelwyn Salisbury, MD  buPROPion ER [facility] SR) 100 MG 12 hr tablet Take 1 tablet (100 mg total) by mouth in the morning. 08/09/22   Nelwyn Salisbury, MD  meloxicam (MOBIC) 15 MG tablet TAKE 1 TABLET(15 MG) BY MOUTH DAILY 05/12/21   Nelwyn Salisbury, MD  methylPREDNISolone (MEDROL DOSEPAK) 4 MG TBPK tablet As directed 12/09/22   Nelwyn Salisbury, MD  Multiple Vitamin (MULTIVITAMIN) tablet Take 1 tablet by mouth daily.      [provider]  Norethin Ace-Eth Estrad-FE (LOESTRIN FE 1/20 PO) Take by mouth daily. Lo lo estrin    [provider]  ondansetron (ZOFRAN) 8 MG tablet Take 1 tablet (8 mg total) by mouth every 8 (eight) hours  as needed for nausea or vomiting. 11/12/21   Nelwyn Salisbury, MD  rizatriptan (MAXALT) 10 MG tablet TAKE 1 TABLET BY MOUTH, MAY REPEAT IN 2 HOURS IF NEEDED 12/27/22   Nelwyn Salisbury, MD  sertraline (ZOLOFT) 100 MG tablet Take 0.5 tablets (50 mg total) by mouth daily. TAKE 1 TABLET DAILY 12/09/22   Nelwyn Salisbury, MD  tiZANidine (ZANAFLEX) 2 MG tablet TAKE 1 TABLET BY MOUTH THREE TIMES DAILY. TAKE 1 TABLET BY MOUTH EVERY NIGHT AS NEEDED 01/30/23   Nelwyn Salisbury, MD  Vitamin D, Ergocalciferol, (DRISDOL) 1.25 MG (50000 UT) CAPS capsule Take one capsule by mouth every 7 days. 11/27/18   Judi Saa, DO    Family History Family  History  Problem Relation Age of Onset   Hypothyroidism Mother    Thyroid disease Mother    Cancer Father        bladder   Hyperlipidemia Father    Hypertension Father    Skin cancer Paternal Grandmother    Colon cancer Paternal Grandfather     Social History Social History   Tobacco Use   Smoking status: Never   Smokeless tobacco: Never  Vaping Use   Vaping status: Never Used  Substance Use Topics   Alcohol use: Yes    Alcohol/week: 0.0 standard drinks of alcohol    Comment: occ   Drug use: No     Allergies   Amoxicillin and Amoxicillin   Review of Systems Review of Systems  Constitutional:  Negative for activity change, chills, diaphoresis, fatigue and fever.  Musculoskeletal:        Pain/swelling left proximal forearm and left wrist pain.  Skin:  Positive for color change.  Neurological:  Negative for weakness and numbness.  All other systems reviewed and are negative.    Physical Exam Triage Vital Signs ED Triage Vitals  Encounter Vitals Group     BP 02/10/23 1016 137/89     Systolic BP Percentile --      Diastolic BP Percentile --      Pulse Rate 02/10/23 1016 71     Resp 02/10/23 1016 16     Temp 02/10/23 1016 98.4 F (36.9 C)     Temp Source 02/10/23 1016 Oral     SpO2 02/10/23 1016 99 %     Weight --      Height --      Head Circumference --      Peak Flow --      Pain Score 02/10/23 1020 3     Pain Loc --      Pain Education --      Exclude from Growth Chart --    No data found.  Updated Vital Signs BP 137/89   Pulse 71   Temp 98.4 F (36.9 C) (Oral)   Resp 16   SpO2 99%   Visual Acuity Right Eye Distance:   Left Eye Distance:   Bilateral Distance:    Right Eye Near:   Left Eye Near:    Bilateral Near:     Physical Exam Vitals and nursing note reviewed.  Constitutional:      General: She is not in acute distress. HENT:     Head: Atraumatic.  Eyes:     Conjunctiva/sclera: Conjunctivae normal.     Pupils: Pupils are  equal, round, and reactive to light.  Cardiovascular:     Rate and Rhythm: Normal rate.  Pulmonary:     Effort: Pulmonary effort is  normal.  Musculoskeletal:     Left wrist: Bony tenderness and snuff box tenderness present. No swelling or crepitus. Decreased range of motion. Normal pulse.       Arms:       Hands:     Cervical back: Normal range of motion.     Comments: Left proximal forearm has area of ecchymosis, swelling, and mild tenderness to palpation as noted on diagram.  Left elbow has full range of motion.  Distal neurovascular function is intact.  Left wrist has tenderness to palpation dorsally, and tenderness to palpation over snuffbox.  No swelling or ecchymosis.    Skin:    General: Skin is warm and dry.  Neurological:     Mental Status: She is alert.      UC Treatments / Results  Labs (all labs ordered are listed, but only abnormal results are displayed) Labs Reviewed - No data to display  EKG   Radiology DG Forearm Left  Result Date: 02/10/2023 CLINICAL DATA:  Pain after fall. Pain about the left elbow, wrist and forearm. EXAM: LEFT FOREARM - 2 VIEW; LEFT WRIST - COMPLETE 3+ VIEW; LEFT ELBOW - COMPLETE 3+ VIEW COMPARISON:  None Available. FINDINGS: Elbow: No acute fracture or dislocation. Normal alignment and joint spaces. No elbow joint effusion. No arthropathy or focal bone abnormality. Unremarkable soft tissues. Forearm: No acute fracture. The cortical margins are intact. No focal bone abnormality or erosion. Unremarkable soft tissues. Wrist: No acute fracture or dislocation. Slight ulna positive variance. Otherwise normal alignment. Joint spaces are preserved. No significant arthropathy or erosion. Unremarkable soft tissues. IMPRESSION: 1. No acute fracture or subluxation of the left elbow, forearm, or wrist. 2. Slight ulna positive variance of the wrist. Electronically Signed   By: Narda Rutherford M.D.   On: 02/10/2023 11:07   DG Wrist Complete Left  Result  Date: 02/10/2023 CLINICAL DATA:  Pain after fall. Pain about the left elbow, wrist and forearm. EXAM: LEFT FOREARM - 2 VIEW; LEFT WRIST - COMPLETE 3+ VIEW; LEFT ELBOW - COMPLETE 3+ VIEW COMPARISON:  None Available. FINDINGS: Elbow: No acute fracture or dislocation. Normal alignment and joint spaces. No elbow joint effusion. No arthropathy or focal bone abnormality. Unremarkable soft tissues. Forearm: No acute fracture. The cortical margins are intact. No focal bone abnormality or erosion. Unremarkable soft tissues. Wrist: No acute fracture or dislocation. Slight ulna positive variance. Otherwise normal alignment. Joint spaces are preserved. No significant arthropathy or erosion. Unremarkable soft tissues. IMPRESSION: 1. No acute fracture or subluxation of the left elbow, forearm, or wrist. 2. Slight ulna positive variance of the wrist. Electronically Signed   By: Narda Rutherford M.D.   On: 02/10/2023 11:07   DG Elbow Complete Left  Result Date: 02/10/2023 CLINICAL DATA:  Pain after fall. Pain about the left elbow, wrist and forearm. EXAM: LEFT FOREARM - 2 VIEW; LEFT WRIST - COMPLETE 3+ VIEW; LEFT ELBOW - COMPLETE 3+ VIEW COMPARISON:  None Available. FINDINGS: Elbow: No acute fracture or dislocation. Normal alignment and joint spaces. No elbow joint effusion. No arthropathy or focal bone abnormality. Unremarkable soft tissues. Forearm: No acute fracture. The cortical margins are intact. No focal bone abnormality or erosion. Unremarkable soft tissues. Wrist: No acute fracture or dislocation. Slight ulna positive variance. Otherwise normal alignment. Joint spaces are preserved. No significant arthropathy or erosion. Unremarkable soft tissues. IMPRESSION: 1. No acute fracture or subluxation of the left elbow, forearm, or wrist. 2. Slight ulna positive variance of the wrist. Electronically Signed  By: Narda Rutherford M.D.   On: 02/10/2023 11:07    Procedures Procedures (including critical care  time)  Medications Ordered in UC Medications - No data to display  Initial Impression / Assessment and Plan / UC Course  I have reviewed the triage vital signs and the nursing notes.  Pertinent labs & imaging results that were available during my care of the patient were reviewed by me and considered in my medical decision making (see chart for details).    Although there is tenderness to palpation over the left wrist snuffbox, navicular view on x-ray shows no fracture. Applied ace wrap to forearm.  Applied left wrist brace.  Dispensed sling. Rx for Vicodin (#10, no refill). Controlled Substance Prescriptions I have consulted the [place] Controlled Substances Registry for this patient, and feel the risk/benefit ratio today is favorable for proceeding with this prescription for a controlled substance.  Followup with orthopedist if not improved two weeks, especially if there is still tenderness over the snuffbox.  Final Clinical Impressions(s) / UC Diagnoses   Final diagnoses:  Contusion of left forearm, initial encounter  Left wrist sprain, initial encounter     Discharge Instructions      Wear ace wrap until swelling forearm resolves.  Wear sling for 5 to 7 days.  Wear left wrist brace for 7 to 10 days.  Apply ice pack for 20 to 30 minutes, 3 to 4 times daily  Continue until pain and swelling decrease.   Begin wrist range of motion and stretching exercises when pain has decreased.    ED Prescriptions     Medication Sig Dispense Auth. Provider   HYDROcodone-acetaminophen (NORCO/VICODIN) 5-325 MG tablet Take 1 tablet by mouth every 6 (six) hours as needed for moderate pain (pain score 4-6) or severe pain (pain score 7-10). 10 tablet Lattie Haw, MD         Lattie Haw, MD 02/12/23 [phone]

## 2023-02-10 NOTE — ED Triage Notes (Signed)
Hit left arm on car when fell day before yesterday. Has pain to left elbow, wrist.

## 2023-02-10 NOTE — Discharge Instructions (Signed)
Wear ace wrap until swelling forearm resolves.  Wear sling for 5 to 7 days.  Wear left wrist brace for 7 to 10 days.  Apply ice pack for 20 to 30 minutes, 3 to 4 times daily  Continue until pain and swelling decrease.   Begin wrist range of motion and stretching exercises when pain has decreased.

## 2023-04-24 ENCOUNTER — Encounter: Payer: Self-pay | Admitting: Family Medicine

## 2023-04-24 NOTE — Telephone Encounter (Unsigned)
Copied from CRM [id number]. Topic: Clinical - Medication Refill >> Apr 24, 2023  1:53 PM [place] A wrote: Most Recent Primary Care Visit:  Provider: Gershon Crane A  Department: [facility]  Visit Type: OFFICE VISIT  Date: 12/09/2022  Medication: TAMIFLU  Has the patient contacted their pharmacy? No (Agent: If no, request that the patient contact the pharmacy for the refill. If patient does not wish to contact the pharmacy document the reason why and proceed with request.) (Agent: If yes, when and what did the pharmacy advise?)  Is this the correct pharmacy for this prescription? Yes If no, delete pharmacy and type the correct one.  This is the patient's preferred pharmacy:  [id number] - [place], [place] - [address] AT [facility] [address] [place] [address] Phone: [phone] Fax: [phone]  [facility] - [place], [place] - [address] [facility] AT Portal to [facility] [address] [place] [address] Phone: [phone] Fax: [phone]   Has the prescription been filled recently? No  Is the patient out of the medication? Yes  Has the patient been seen for an appointment in the last year OR does the patient have an upcoming appointment? Yes  Can we respond through MyChart? Yes  Agent: Please be advised that Rx refills may take up to 3 business days. We ask that you follow-up with your pharmacy.

## 2023-04-27 NOTE — Telephone Encounter (Signed)
 No there are shortages of Tamiflu  so we are using it only for sick people, not for prophylaxis

## 2023-05-25 ENCOUNTER — Encounter: Payer: Self-pay | Admitting: Family Medicine

## 2023-05-29 MED ORDER — ALPRAZOLAM 1 MG PO TABS: ORAL_TABLET | ORAL | 5 refills | Status: DC

## 2023-05-29 NOTE — Telephone Encounter (Signed)
 Done

## 2023-08-15 ENCOUNTER — Encounter: Payer: Self-pay | Admitting: Family Medicine

## 2023-08-16 ENCOUNTER — Other Ambulatory Visit: Payer: Self-pay | Admitting: Family Medicine

## 2023-08-16 MED ORDER — MELOXICAM 15 MG PO TABS: ORAL_TABLET | ORAL | 3 refills | Status: AC

## 2023-08-16 MED ORDER — TIZANIDINE HCL 2 MG PO TABS: 2.0000 mg | ORAL_TABLET | Freq: Three times a day (TID) | ORAL | 3 refills | Status: AC

## 2023-08-16 MED ORDER — MELOXICAM 15 MG PO TABS: ORAL_TABLET | ORAL | 0 refills | Status: DC

## 2023-08-16 NOTE — Telephone Encounter (Signed)
 Rx sent for Meloxicam -forwarded to PCP for Tizanidine .

## 2023-08-16 NOTE — Telephone Encounter (Signed)
 Done

## 2023-09-13 ENCOUNTER — Telehealth (INDEPENDENT_AMBULATORY_CARE_PROVIDER_SITE_OTHER): Admitting: Family Medicine

## 2023-09-13 ENCOUNTER — Encounter: Payer: Self-pay | Admitting: Family Medicine

## 2023-09-13 DIAGNOSIS — J0100 Acute maxillary sinusitis, unspecified: Secondary | ICD-10-CM

## 2023-09-13 MED ORDER — PROMETHAZINE-DM 6.25-15 MG/5ML PO SYRP: 5.0000 mL | ORAL_SOLUTION | Freq: Four times a day (QID) | ORAL | 0 refills | Status: AC | PRN

## 2023-09-13 MED ORDER — METHYLPREDNISOLONE 4 MG PO TBPK: ORAL_TABLET | ORAL | 0 refills | Status: AC

## 2023-09-13 MED ORDER — DOXYCYCLINE HYCLATE 100 MG PO CAPS: 100.0000 mg | ORAL_CAPSULE | Freq: Two times a day (BID) | ORAL | 0 refills | Status: AC

## 2023-09-13 NOTE — Progress Notes (Signed)
 Patient ID: Leslie Hughes, female   DOB: 1975/05/28, 48 y.o.   MRN: [id number]   Virtual Visit via Video Note  I connected with Leslie Hughes on 09/13/23 at  1:30 PM EDT by a video enabled telemedicine application and verified that I am speaking with the correct person using two identifiers.  Location patient: home Location provider:work or home office Persons participating in the virtual visit: patient, provider  I discussed the limitations of evaluation and management by telemedicine and the availability of in person appointments. The patient expressed understanding and agreed to proceed.   HPI:   Leslie Hughes is seen as a work in with upper respiratory symptoms which started almost 10 days ago.  She was down at [employer] in [place]  and since she got back developed severe sore throat.  She did home COVID test which came back negative.  She initially suspect that this may be COVID related though.  Her sore throat is better now but now she has fairly severe cough and increasing sinus pressure especially maxillary sinuses and upper teeth pain.  She has had some greenish nasal discharge.  Had some initial fever first couple days but none since then.  Cough is very severe at times and interfering with sleep.  Does not tolerate hydrocodone  well.  Generally fairly healthy.  She relates she has taken [employer] in the past for cough which have not worked very well for her.  No chronic lung issues.  ROS: See pertinent positives and negatives per HPI.  Past Medical History:  Diagnosis Date   Allergy    Cervical dystonia    sees Dr. Edward at [facility]    Degenerative arthritis of right knee 09/10/2015   Started viscous supplementation impression 22nd 2017   Metrorrhagia    sees Dr. Truman Corona    Migraine    Vitamin D  deficiency     Past Surgical History:  Procedure Laterality Date   CESAREAN SECTION  2009   KNEE SURGERY  1999 and 2008   arthroscopy to right knee (tennis  injuries)    Family History  Problem Relation Age of Onset   Hypothyroidism Mother    Thyroid  disease Mother    Cancer Father        bladder   Hyperlipidemia Father    Hypertension Father    Skin cancer Paternal Grandmother    Colon cancer Paternal Grandfather     SOCIAL HX: Non-smoker   Current Outpatient Medications:    ALPRAZolam  (XANAX ) 1 MG tablet, TAKE 1 TABLET(1 MG) BY MOUTH TWICE DAILY AS NEEDED FOR ANXIETY, Disp: 60 tablet, Rfl: 5   buPROPion  ER (WELLBUTRIN  SR) 100 MG 12 hr tablet, Take 1 tablet (100 mg total) by mouth in the morning., Disp: 30 tablet, Rfl: 5   HYDROcodone -acetaminophen  (NORCO/VICODIN) 5-325 MG tablet, Take 1 tablet by mouth every 6 (six) hours as needed for moderate pain (pain score 4-6) or severe pain (pain score 7-10)., Disp: 10 tablet, Rfl: 0   meloxicam  (MOBIC ) 15 MG tablet, TAKE 1 TABLET(15 MG) BY MOUTH DAILY, Disp: 90 tablet, Rfl: 3   Multiple Vitamin (MULTIVITAMIN) tablet, Take 1 tablet by mouth daily.  , Disp: , Rfl:    Norethin Ace-Eth Estrad-FE (LOESTRIN FE 1/20 PO), Take by mouth daily. Lo lo estrin, Disp: , Rfl:    ondansetron  (ZOFRAN ) 8 MG tablet, Take 1 tablet (8 mg total) by mouth every 8 (eight) hours as needed for nausea or vomiting., Disp: 90 tablet, Rfl: 2   rizatriptan  (  MAXALT ) 10 MG tablet, TAKE ONE TABLET BY MOUTH ONCE DAILY AS DIRECTED. MAY REPEAT IN 2 HOURS IF NEEDED (MAX DAILY DOSE 2 TABS), Disp: 12 tablet, Rfl: 0   sertraline  (ZOLOFT ) 100 MG tablet, Take 0.5 tablets (50 mg total) by mouth daily. TAKE 1 TABLET DAILY, Disp: , Rfl:    tiZANidine  (ZANAFLEX ) 2 MG tablet, Take 1 tablet (2 mg total) by mouth 3 (three) times daily., Disp: 270 tablet, Rfl: 3   Vitamin D , Ergocalciferol , (DRISDOL ) 1.25 MG (50000 UT) CAPS capsule, Take one capsule by mouth every 7 days., Disp: 12 capsule, Rfl: 0   doxycycline  (VIBRAMYCIN ) 100 MG capsule, Take 1 capsule (100 mg total) by mouth 2 (two) times daily., Disp: 14 capsule, Rfl: 0   methylPREDNISolone   (MEDROL  DOSEPAK) 4 MG TBPK tablet, As directed, Disp: 21 tablet, Rfl: 0   promethazine -dextromethorphan (PROMETHAZINE -DM) 6.25-15 MG/5ML syrup, Take 5 mLs by mouth 4 (four) times daily as needed for cough., Disp: 118 mL, Rfl: 0  EXAM:  VITALS per patient if applicable:  GENERAL: alert, oriented, appears well and in no acute distress  HEENT: atraumatic, conjunttiva clear, no obvious abnormalities on inspection of external nose and ears  NECK: normal movements of the head and neck  LUNGS: on inspection no signs of respiratory distress, breathing rate appears normal, no obvious gross SOB, gasping or wheezing  CV: no obvious cyanosis  MS: moves all visible extremities without noticeable abnormality  PSYCH/NEURO: pleasant and cooperative, no obvious depression or anxiety, speech and thought processing grossly intact  ASSESSMENT AND PLAN:  Discussed the following assessment and plan:  Acute maxillary sinusitis.  May have started off viral but she has had some progressive upper teeth pain, purulent discharge, and worsening cough.  -Promethazine  DM cough syrup 1 teaspoon every 6 hours as needed - Doxycycline  100 mg twice daily for 7 days - Patient states she has benefited in the past from combination of Medrol  Dosepak with steroid.  We sent in Medrol  Dosepak. - Follow-up for any persistent or worsening symptoms.     I discussed the assessment and treatment plan with the patient. The patient was provided an opportunity to ask questions and all were answered. The patient agreed with the plan and demonstrated an understanding of the instructions.   The patient was advised to call back or seek an in-person evaluation if the symptoms worsen or if the condition fails to improve as anticipated.     Wolm Scarlet, MD

## 2023-09-25 ENCOUNTER — Other Ambulatory Visit: Payer: Self-pay | Admitting: Family Medicine

## 2023-12-01 ENCOUNTER — Other Ambulatory Visit: Payer: Self-pay | Admitting: Family Medicine

## 2024-01-03 ENCOUNTER — Ambulatory Visit: Admitting: Family Medicine

## 2024-01-03 ENCOUNTER — Other Ambulatory Visit: Payer: Self-pay

## 2024-01-03 VITALS — BP 110/72 | HR 80 | Ht 69.0 in | Wt 126.0 lb

## 2024-01-03 DIAGNOSIS — M25562 Pain in left knee: Secondary | ICD-10-CM

## 2024-01-03 DIAGNOSIS — M1711 Unilateral primary osteoarthritis, right knee: Secondary | ICD-10-CM | POA: Diagnosis not present

## 2024-01-03 DIAGNOSIS — R5383 Other fatigue: Secondary | ICD-10-CM | POA: Diagnosis not present

## 2024-01-03 LAB — COMPREHENSIVE METABOLIC PANEL WITH GFR
ALT: 14 U/L (ref 0–35)
AST: 15 U/L (ref 0–37)
Albumin: 4.9 g/dL (ref 3.5–5.2)
Alkaline Phosphatase: 44 U/L (ref 39–117)
BUN: 11 mg/dL (ref 6–23)
CO2: 25 meq/L (ref 19–32)
Calcium: 9.5 mg/dL (ref 8.4–10.5)
Chloride: 106 meq/L (ref 96–112)
Creatinine, Ser: 0.85 mg/dL (ref 0.40–1.20)
GFR: 82.5 mL/min (ref >60.00)
Glucose, Bld: 88 mg/dL (ref 70–99)
Potassium: 3.9 meq/L (ref 3.5–5.1)
Sodium: 140 meq/L (ref 135–145)
Total Bilirubin: 0.4 mg/dL (ref 0.2–1.2)
Total Protein: 7.6 g/dL (ref 6.0–8.3)

## 2024-01-03 LAB — IBC PANEL
Iron: 143 ug/dL (ref 42–145)
Saturation Ratios: 31.6 % (ref 20.0–50.0)
TIBC: 452.2 ug/dL — ABNORMAL HIGH (ref 250.0–450.0)
Transferrin: 323 mg/dL (ref 212.0–360.0)

## 2024-01-03 LAB — FERRITIN: Ferritin: 51.4 ng/mL (ref 10.0–291.0)

## 2024-01-03 LAB — URIC ACID: Uric Acid, Serum: 3.4 mg/dL (ref 2.4–7.0)

## 2024-01-03 LAB — SEDIMENTATION RATE: Sed Rate: 6 mm/h (ref 0–20)

## 2024-01-03 LAB — VITAMIN D 25 HYDROXY (VIT D DEFICIENCY, FRACTURES): VITD: 25.74 ng/mL — ABNORMAL LOW (ref 30.00–100.00)

## 2024-01-03 LAB — VITAMIN B12: Vitamin B-12: 179 pg/mL — ABNORMAL LOW (ref 211–911)

## 2024-01-03 NOTE — Progress Notes (Unsigned)
 [name] [address] [place] [address] Phone: [phone] Subjective:   [name] [name], am serving as a scribe for Dr. Arthea Claudene.  I'm seeing this patient by the request  of:  Johnny Garnette LABOR, MD  CC: Right knee pain  YEP:Dlagzrupcz  Leslie Hughes is a 48 y.o. female coming in with complaint of R knee pain. Standing a lot more at work. Driving also exacerbates her pain. PRP was helpful last visit.  Patient is wondering if she needs it again.  Started to workout on a more regular basis again.  Finds it difficult though secondary to the discomfort.    Past Medical History:  Diagnosis Date   Allergy    Cervical dystonia    sees Dr. Edward at [facility]    Degenerative arthritis of right knee 09/10/2015   Started viscous supplementation impression 22nd 2017   Metrorrhagia    sees Dr. Truman Corona    Migraine    Vitamin D  deficiency    Past Surgical History:  Procedure Laterality Date   CESAREAN SECTION  2009   KNEE SURGERY  1999 and 2008   arthroscopy to right knee (tennis injuries)   Social History   Socioeconomic History   Marital status: Married    Spouse name: Not on file   Number of children: 1   Years of education: 16   Highest education level: Bachelor's degree (e.g., BA, AB, BS)  Occupational History   Occupation: restaurant  Tobacco Use   Smoking status: Never   Smokeless tobacco: Never  Vaping Use   Vaping status: Never Used  Substance and Sexual Activity   Alcohol use: Yes    Alcohol/week: 0.0 standard drinks of alcohol    Comment: occ   Drug use: No   Sexual activity: Not on file  Other Topics Concern   Not on file  Social History Narrative   Right handed   Two story home   College grad   She lives with husband and son   She own IT trainer   Social Drivers of Health   Financial Resource Strain: Low Risk  (07/13/2022)   Overall Financial Resource Strain (CARDIA)    Difficulty of  Paying Living Expenses: Not hard at all  Food Insecurity: No Food Insecurity (07/13/2022)   Hunger Vital Sign    Worried About Running Out of Food in the Last Year: Never true    Ran Out of Food in the Last Year: Never true  Transportation Needs: No Transportation Needs (07/13/2022)   PRAPARE - Administrator, Civil Service (Medical): No    Lack of Transportation (Non-Medical): No  Physical Activity: Unknown (07/13/2022)   Exercise Vital Sign    Days of Exercise per Week: 0 days    Minutes of Exercise per Session: Not on file  Stress: Stress Concern Present (07/13/2022)   [employer] of Occupational Health - Occupational Stress Questionnaire    Feeling of Stress : Very much  Social Connections: Unknown (07/13/2022)   Social Connection and Isolation Panel    Frequency of Communication with Friends and Family: Patient declined    Frequency of Social Gatherings with Friends and Family: Patient declined    Attends Religious Services: Patient declined    Database administrator or Organizations: Patient declined    Attends Banker Meetings: Not on file    Marital Status: Married   Allergies  Allergen Reactions   Amoxicillin Rash  Amoxicillin    Family History  Problem Relation Age of Onset   Hypothyroidism Mother    Thyroid  disease Mother    Cancer Father        bladder   Hyperlipidemia Father    Hypertension Father    Skin cancer Paternal Grandmother    Colon cancer Paternal Grandfather     Current Outpatient Medications (Endocrine & Metabolic):    methylPREDNISolone  (MEDROL  DOSEPAK) 4 MG TBPK tablet, As directed   Norethin Ace-Eth Estrad-FE (LOESTRIN FE 1/20 PO), Take by mouth daily. Lo lo estrin   Current Outpatient Medications (Respiratory):    promethazine -dextromethorphan (PROMETHAZINE -DM) 6.25-15 MG/5ML syrup, Take 5 mLs by mouth 4 (four) times daily as needed for cough.  Current Outpatient Medications (Analgesics):     HYDROcodone -acetaminophen  (NORCO/VICODIN) 5-325 MG tablet, Take 1 tablet by mouth every 6 (six) hours as needed for moderate pain (pain score 4-6) or severe pain (pain score 7-10).   meloxicam  (MOBIC ) 15 MG tablet, TAKE 1 TABLET(15 MG) BY MOUTH DAILY   rizatriptan  (MAXALT ) 10 MG tablet, TAKE ONE TABLET BY MOUTH ONCE DAILY AS DIRECTED. MAY REPEAT IN 2 HOURS IF NEEDED (MAX DAILY DOSE 2 TABS)   Current Outpatient Medications (Other):    ALPRAZolam  (XANAX ) 1 MG tablet, TAKE ONE TABLET BY MOUTH TWICE A DAY AS NEEDED FOR ANXIETY   buPROPion  ER (WELLBUTRIN  SR) 100 MG 12 hr tablet, Take 1 tablet (100 mg total) by mouth in the morning.   doxycycline  (VIBRAMYCIN ) 100 MG capsule, Take 1 capsule (100 mg total) by mouth 2 (two) times daily.   Multiple Vitamin (MULTIVITAMIN) tablet, Take 1 tablet by mouth daily.     ondansetron  (ZOFRAN ) 8 MG tablet, Take 1 tablet (8 mg total) by mouth every 8 (eight) hours as needed for nausea or vomiting.   sertraline  (ZOLOFT ) 100 MG tablet, Take 0.5 tablets (50 mg total) by mouth daily. TAKE 1 TABLET DAILY   tiZANidine  (ZANAFLEX ) 2 MG tablet, Take 1 tablet (2 mg total) by mouth 3 (three) times daily.   Vitamin D , Ergocalciferol , (DRISDOL ) 1.25 MG (50000 UT) CAPS capsule, Take one capsule by mouth every 7 days.   Reviewed prior external information including notes and imaging from  primary care provider As well as notes that were available from care everywhere and other healthcare systems.  Past medical history, social, surgical and family history all reviewed in electronic medical record.  No pertanent information unless stated regarding to the chief complaint.   Review of Systems:  No headache, visual changes, nausea, vomiting, diarrhea, constipation, dizziness, abdominal pain, skin rash, fevers, chills, night sweats, weight loss, swollen lymph nodes, body aches, joint swelling, chest pain, shortness of breath, mood changes. POSITIVE muscle aches  Objective  Blood  pressure 110/72, pulse 80, height 5' 9 (1.753 m), weight 126 lb (57.2 kg), SpO2 96%.   General: No apparent distress alert and oriented x3 mood and affect normal, dressed appropriately.  HEENT: Pupils equal, extraocular movements intact  Respiratory: Patient's speak in full sentences and does not appear short of breath  Cardiovascular: No lower extremity edema, non tender, no erythema  Right knee does have some crepitus noted.  Some lateral tracking of the patella noted.  Positive patellar grind test noted.   Limited muscular skeletal ultrasound was performed and interpreted by CLAUDENE HUSSAR, M  Trace effusion noted of the patellofemoral space with hypoechoic changes consistent with a small effusion. Patient's medial and lateral meniscus appear to be unremarkable.  Patient does have some narrowing noted  also of the medial joint space. 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 Right knee was prepped with alcohol swab and utilizing anterolateral approach, patient's right knee space was injected with 4:1  marcaine 0.5%: Kenalog  40mg /dL. Patient tolerated the procedure well without immediate complications.   Impression and Recommendations:    The above documentation has been reviewed and is accurate and complete Claressa Hughley M Wataru Mccowen, DO

## 2024-01-03 NOTE — Patient Instructions (Addendum)
 Injected knee today Bracing with activity Lab today See me again in 2-3 months

## 2024-01-04 ENCOUNTER — Other Ambulatory Visit: Payer: Self-pay

## 2024-01-04 ENCOUNTER — Encounter: Payer: Self-pay | Admitting: Family Medicine

## 2024-01-04 ENCOUNTER — Ambulatory Visit: Payer: Self-pay | Admitting: Family Medicine

## 2024-01-04 MED ORDER — VITAMIN D (ERGOCALCIFEROL) 1.25 MG (50000 UNIT) PO CAPS: 50000.0000 [IU] | ORAL_CAPSULE | ORAL | 0 refills | Status: AC

## 2024-01-04 NOTE — Assessment & Plan Note (Signed)
 Patient is having an acute flare of the arthritic changes noted.  No increasing instability.  Hopefully this will make a significant difference and keep patient more active.  Discussed icing regimen and home exercises, discussed which activities to do and which ones to avoid.  Most of it seems to be more in the patellofemoral joint and a Tru pull lite brace given for stability.  Could be a candidate again for viscosupplementation but also had significant improvement with the PRP and can do think that that would be helpful.  Follow-up again in 6 to 8 weeks

## 2024-01-08 LAB — ANA: Anti Nuclear Antibody (ANA): POSITIVE — AB

## 2024-01-08 LAB — ANTI-NUCLEAR AB-TITER (ANA TITER): ANA Titer 1: 1:160 {titer} — ABNORMAL HIGH

## 2024-01-08 LAB — PTH, INTACT AND CALCIUM
Calcium: 9.7 mg/dL (ref 8.6–10.2)
PTH: 19 pg/mL (ref 16–77)

## 2024-01-12 ENCOUNTER — Encounter: Payer: Self-pay | Admitting: Family Medicine

## 2024-01-12 DIAGNOSIS — R7689 Other specified abnormal immunological findings in serum: Secondary | ICD-10-CM

## 2024-01-17 ENCOUNTER — Ambulatory Visit: Admitting: Family Medicine

## 2024-01-25 ENCOUNTER — Ambulatory Visit: Admitting: Family Medicine

## 2024-03-01 NOTE — Progress Notes (Deleted)
 [facility] [address] [place] [address] Phone: [phone] Subjective:    I'm seeing this patient by the request  of:  Johnny Garnette LABOR, MD  CC:   YEP:Dlagzrupcz  01/03/2024 Patient is having an acute flare of the arthritic changes noted. No increasing instability. Hopefully this will make a significant difference and keep patient more active. Discussed icing regimen and home exercises, discussed which activities to do and which ones to avoid. Most of it seems to be more in the patellofemoral joint and a Tru pull lite brace given for stability. Could be a candidate again for viscosupplementation but also had significant improvement with the PRP and can do think that that would be helpful. Follow-up again in 6 to 8 weeks   Updated 03/04/2024 Leslie Hughes is a 48 y.o. female coming in with complaint of R knee pain. Here for PRP      Past Medical History:  Diagnosis Date   Allergy    Cervical dystonia    sees Dr. Edward at [facility]    Degenerative arthritis of right knee 09/10/2015   Started viscous supplementation impression 22nd 2017   Metrorrhagia    sees Dr. Truman Corona    Migraine    Vitamin D  deficiency    Past Surgical History:  Procedure Laterality Date   CESAREAN SECTION  2009   KNEE SURGERY  1999 and 2008   arthroscopy to right knee (tennis injuries)   Social History   Socioeconomic History   Marital status: Married    Spouse name: Not on file   Number of children: 1   Years of education: 16   Highest education level: Bachelor's degree (e.g., BA, AB, BS)  Occupational History   Occupation: restaurant  Tobacco Use   Smoking status: Never   Smokeless tobacco: Never  Vaping Use   Vaping status: Never Used  Substance and Sexual Activity   Alcohol use: Yes    Alcohol/week: 0.0 standard drinks of alcohol    Comment: occ   Drug use: No   Sexual activity: Not on file  Other Topics Concern   Not on file   Social History Narrative   Right handed   Two story home   College grad   She lives with husband and son   She own restaurant franchises   Social Drivers of Health   Tobacco Use: Low Risk (01/04/2024)   Patient History    Smoking Tobacco Use: Never    Smokeless Tobacco Use: Never    Passive Exposure: Not on file  Financial Resource Strain: Low Risk (07/13/2022)   Overall Financial Resource Strain (CARDIA)    Difficulty of Paying Living Expenses: Not hard at all  Food Insecurity: No Food Insecurity (07/13/2022)   Hunger Vital Sign    Worried About Running Out of Food in the Last Year: Never true    Ran Out of Food in the Last Year: Never true  Transportation Needs: No Transportation Needs (07/13/2022)   PRAPARE - Administrator, Civil Service (Medical): No    Lack of Transportation (Non-Medical): No  Physical Activity: Unknown (07/13/2022)   Exercise Vital Sign    Days of Exercise per Week: 0 days    Minutes of Exercise per Session: Not on file  Stress: Stress Concern Present (07/13/2022)   [employer] of Occupational Health - Occupational Stress Questionnaire    Feeling of Stress : Very much  Social Connections: Unknown (07/13/2022)   Social  Connection and Isolation Panel    Frequency of Communication with Friends and Family: Patient declined    Frequency of Social Gatherings with Friends and Family: Patient declined    Attends Religious Services: Patient declined    Active Member of Clubs or Organizations: Patient declined    Attends Banker Meetings: Not on file    Marital Status: Married  Depression (PHQ2-9): High Risk (09/13/2023)   Depression (PHQ2-9)    PHQ-2 Score: 21  Alcohol Screen: Low Risk (07/13/2022)   Alcohol Screen    Last Alcohol Screening Score (AUDIT): 2  Housing: Low Risk (07/13/2022)   Housing    Last Housing Risk Score: 0  Utilities: Not on file  Health Literacy: Not on file   Allergies[1] Family History  Problem  Relation Age of Onset   Hypothyroidism Mother    Thyroid  disease Mother    Cancer Father        bladder   Hyperlipidemia Father    Hypertension Father    Skin cancer Paternal Grandmother    Colon cancer Paternal Grandfather     Current Outpatient Medications (Endocrine & Metabolic):    methylPREDNISolone  (MEDROL  DOSEPAK) 4 MG TBPK tablet, As directed   Norethin Ace-Eth Estrad-FE (LOESTRIN FE 1/20 PO), Take by mouth daily. Lo lo estrin  Current Outpatient Medications (Respiratory):    promethazine -dextromethorphan (PROMETHAZINE -DM) 6.25-15 MG/5ML syrup, Take 5 mLs by mouth 4 (four) times daily as needed for cough.  Current Outpatient Medications (Analgesics):    HYDROcodone -acetaminophen  (NORCO/VICODIN) 5-325 MG tablet, Take 1 tablet by mouth every 6 (six) hours as needed for moderate pain (pain score 4-6) or severe pain (pain score 7-10).   meloxicam  (MOBIC ) 15 MG tablet, TAKE 1 TABLET(15 MG) BY MOUTH DAILY   rizatriptan  (MAXALT ) 10 MG tablet, TAKE ONE TABLET BY MOUTH ONCE DAILY AS DIRECTED. MAY REPEAT IN 2 HOURS IF NEEDED (MAX DAILY DOSE 2 TABS)  Current Outpatient Medications (Other):    Vitamin D , Ergocalciferol , (DRISDOL ) 1.25 MG (50000 UNIT) CAPS capsule, Take 1 capsule (50,000 Units total) by mouth every 7 (seven) days.   ALPRAZolam  (XANAX ) 1 MG tablet, TAKE ONE TABLET BY MOUTH TWICE A DAY AS NEEDED FOR ANXIETY   buPROPion  ER (WELLBUTRIN  SR) 100 MG 12 hr tablet, Take 1 tablet (100 mg total) by mouth in the morning.   doxycycline  (VIBRAMYCIN ) 100 MG capsule, Take 1 capsule (100 mg total) by mouth 2 (two) times daily.   Multiple Vitamin (MULTIVITAMIN) tablet, Take 1 tablet by mouth daily.     ondansetron  (ZOFRAN ) 8 MG tablet, Take 1 tablet (8 mg total) by mouth every 8 (eight) hours as needed for nausea or vomiting.   sertraline  (ZOLOFT ) 100 MG tablet, Take 0.5 tablets (50 mg total) by mouth daily. TAKE 1 TABLET DAILY   tiZANidine  (ZANAFLEX ) 2 MG tablet, Take 1 tablet (2 mg total)  by mouth 3 (three) times daily.   Vitamin D , Ergocalciferol , (DRISDOL ) 1.25 MG (50000 UT) CAPS capsule, Take one capsule by mouth every 7 days.   Reviewed prior external information including notes and imaging from  primary care provider As well as notes that were available from care everywhere and other healthcare systems.  Past medical history, social, surgical and family history all reviewed in electronic medical record.  No pertanent information unless stated regarding to the chief complaint.   Review of Systems:  No headache, visual changes, nausea, vomiting, diarrhea, constipation, dizziness, abdominal pain, skin rash, fevers, chills, night sweats, weight loss, swollen lymph nodes, body  aches, joint swelling, chest pain, shortness of breath, mood changes. POSITIVE muscle aches  Objective  There were no vitals taken for this visit.   General: No apparent distress alert and oriented x3 mood and affect normal, dressed appropriately.  HEENT: Pupils equal, extraocular movements intact  Respiratory: Patient's speak in full sentences and does not appear short of breath  Cardiovascular: No lower extremity edema, non tender, no erythema      Impression and Recommendations:           [1]  Allergies Allergen Reactions   Amoxicillin Rash   Amoxicillin

## 2024-03-04 ENCOUNTER — Ambulatory Visit: Admitting: Family Medicine

## 2024-04-02 ENCOUNTER — Ambulatory Visit: Admitting: Family Medicine

## 2024-04-03 ENCOUNTER — Ambulatory Visit: Admitting: Family Medicine

## 2024-04-17 ENCOUNTER — Ambulatory Visit: Admitting: Family Medicine
